# Patient Record
Sex: Male | Born: 1965 | Race: Black or African American | Hispanic: No | Marital: Single | Smoking: Never smoker
Health system: Southern US, Community
[De-identification: ages and names within clinical notes are randomized; demographics above are authoritative.]

## PROBLEM LIST (undated history)

## (undated) DIAGNOSIS — D696 Thrombocytopenia, unspecified: Secondary | ICD-10-CM

## (undated) DIAGNOSIS — C7800 Secondary malignant neoplasm of unspecified lung: Secondary | ICD-10-CM

## (undated) DIAGNOSIS — C229 Malignant neoplasm of liver, not specified as primary or secondary: Secondary | ICD-10-CM

## (undated) HISTORY — DX: Thrombocytopenia, unspecified: D69.6

---

## 1997-06-05 ENCOUNTER — Emergency Department (HOSPITAL_COMMUNITY): Admission: EM | Admit: 1997-06-05 | Discharge: 1997-06-05 | Payer: Self-pay | Admitting: Emergency Medicine

## 2000-11-16 ENCOUNTER — Emergency Department (HOSPITAL_COMMUNITY): Admission: EM | Admit: 2000-11-16 | Discharge: 2000-11-16 | Payer: Self-pay | Admitting: Emergency Medicine

## 2000-11-16 ENCOUNTER — Encounter: Payer: Self-pay | Admitting: Emergency Medicine

## 2001-03-15 ENCOUNTER — Emergency Department (HOSPITAL_COMMUNITY): Admission: EM | Admit: 2001-03-15 | Discharge: 2001-03-15 | Payer: Self-pay | Admitting: Emergency Medicine

## 2001-03-15 ENCOUNTER — Encounter: Payer: Self-pay | Admitting: Emergency Medicine

## 2001-04-24 ENCOUNTER — Emergency Department (HOSPITAL_COMMUNITY): Admission: EM | Admit: 2001-04-24 | Discharge: 2001-04-24 | Payer: Self-pay | Admitting: *Deleted

## 2001-10-16 ENCOUNTER — Inpatient Hospital Stay (HOSPITAL_COMMUNITY): Admission: EM | Admit: 2001-10-16 | Discharge: 2001-10-19 | Payer: Self-pay | Admitting: Emergency Medicine

## 2001-10-16 ENCOUNTER — Encounter: Payer: Self-pay | Admitting: Internal Medicine

## 2001-10-16 ENCOUNTER — Encounter: Payer: Self-pay | Admitting: Emergency Medicine

## 2001-10-17 ENCOUNTER — Encounter: Payer: Self-pay | Admitting: Cardiology

## 2001-10-31 ENCOUNTER — Emergency Department (HOSPITAL_COMMUNITY): Admission: EM | Admit: 2001-10-31 | Discharge: 2001-10-31 | Payer: Self-pay

## 2001-11-05 ENCOUNTER — Encounter: Admission: RE | Admit: 2001-11-05 | Discharge: 2001-11-05 | Payer: Self-pay | Admitting: Internal Medicine

## 2001-11-26 ENCOUNTER — Encounter: Admission: RE | Admit: 2001-11-26 | Discharge: 2001-11-26 | Payer: Self-pay | Admitting: Internal Medicine

## 2001-12-14 ENCOUNTER — Encounter: Payer: Self-pay | Admitting: Internal Medicine

## 2001-12-14 ENCOUNTER — Ambulatory Visit (HOSPITAL_COMMUNITY): Admission: RE | Admit: 2001-12-14 | Discharge: 2001-12-14 | Payer: Self-pay | Admitting: Internal Medicine

## 2002-06-17 ENCOUNTER — Emergency Department (HOSPITAL_COMMUNITY): Admission: EM | Admit: 2002-06-17 | Discharge: 2002-06-17 | Payer: Self-pay | Admitting: Emergency Medicine

## 2002-06-21 ENCOUNTER — Encounter: Admission: RE | Admit: 2002-06-21 | Discharge: 2002-06-21 | Payer: Self-pay | Admitting: Internal Medicine

## 2003-09-09 ENCOUNTER — Emergency Department (HOSPITAL_COMMUNITY): Admission: EM | Admit: 2003-09-09 | Discharge: 2003-09-09 | Payer: Self-pay | Admitting: Emergency Medicine

## 2003-09-27 ENCOUNTER — Emergency Department (HOSPITAL_COMMUNITY): Admission: EM | Admit: 2003-09-27 | Discharge: 2003-09-27 | Payer: Self-pay | Admitting: Emergency Medicine

## 2004-07-11 ENCOUNTER — Emergency Department (HOSPITAL_COMMUNITY): Admission: EM | Admit: 2004-07-11 | Discharge: 2004-07-11 | Payer: Self-pay | Admitting: *Deleted

## 2004-11-03 ENCOUNTER — Emergency Department (HOSPITAL_COMMUNITY): Admission: EM | Admit: 2004-11-03 | Discharge: 2004-11-03 | Payer: Self-pay | Admitting: Emergency Medicine

## 2005-04-03 ENCOUNTER — Emergency Department (HOSPITAL_COMMUNITY): Admission: EM | Admit: 2005-04-03 | Discharge: 2005-04-03 | Payer: Self-pay | Admitting: Emergency Medicine

## 2005-11-28 ENCOUNTER — Emergency Department (HOSPITAL_COMMUNITY): Admission: EM | Admit: 2005-11-28 | Discharge: 2005-11-28 | Payer: Self-pay | Admitting: Emergency Medicine

## 2006-01-03 ENCOUNTER — Emergency Department (HOSPITAL_COMMUNITY): Admission: EM | Admit: 2006-01-03 | Discharge: 2006-01-03 | Payer: Self-pay | Admitting: Emergency Medicine

## 2006-11-25 ENCOUNTER — Inpatient Hospital Stay (HOSPITAL_COMMUNITY): Admission: EM | Admit: 2006-11-25 | Discharge: 2006-11-26 | Payer: Self-pay | Admitting: Emergency Medicine

## 2007-04-10 ENCOUNTER — Emergency Department (HOSPITAL_COMMUNITY): Admission: EM | Admit: 2007-04-10 | Discharge: 2007-04-10 | Payer: Self-pay | Admitting: Emergency Medicine

## 2008-04-11 ENCOUNTER — Emergency Department (HOSPITAL_COMMUNITY): Admission: EM | Admit: 2008-04-11 | Discharge: 2008-04-11 | Payer: Self-pay | Admitting: Emergency Medicine

## 2009-01-14 ENCOUNTER — Emergency Department (HOSPITAL_COMMUNITY): Admission: EM | Admit: 2009-01-14 | Discharge: 2009-01-14 | Payer: Self-pay | Admitting: Emergency Medicine

## 2010-01-24 ENCOUNTER — Emergency Department (HOSPITAL_COMMUNITY): Admission: EM | Admit: 2010-01-24 | Discharge: 2010-01-24 | Payer: Self-pay | Admitting: Emergency Medicine

## 2010-01-25 ENCOUNTER — Ambulatory Visit: Payer: Self-pay | Admitting: Internal Medicine

## 2010-05-11 LAB — CBC
MCHC: 35.3 g/dL (ref 30.0–36.0)
Platelets: 60 10*3/uL — ABNORMAL LOW (ref 150–400)
RBC: 4.62 MIL/uL (ref 4.22–5.81)
RDW: 13.7 % (ref 11.5–15.5)

## 2010-05-11 LAB — DIFFERENTIAL
Eosinophils Absolute: 0 10*3/uL (ref 0.0–0.7)
Lymphocytes Relative: 28 % (ref 12–46)
Monocytes Relative: 11 % (ref 3–12)
Neutro Abs: 1.8 10*3/uL (ref 1.7–7.7)
Neutrophils Relative %: 59 % (ref 43–77)

## 2010-06-02 LAB — DIFFERENTIAL
Basophils Absolute: 0 10*3/uL (ref 0.0–0.1)
Basophils Relative: 0 % (ref 0–1)
Eosinophils Absolute: 0 10*3/uL (ref 0.0–0.7)
Neutro Abs: 1.7 10*3/uL (ref 1.7–7.7)
Neutrophils Relative %: 44 % (ref 43–77)

## 2010-06-02 LAB — BASIC METABOLIC PANEL
CO2: 29 mEq/L (ref 19–32)
Calcium: 9.3 mg/dL (ref 8.4–10.5)
Chloride: 105 mEq/L (ref 96–112)
GFR calc non Af Amer: >60 mL/min (ref >60)
Glucose, Bld: 83 mg/dL (ref 70–99)
Potassium: 3.7 mEq/L (ref 3.5–5.1)
Sodium: 141 mEq/L (ref 135–145)

## 2010-06-02 LAB — CBC: RDW: 14.1 % (ref 11.5–15.5)

## 2010-06-02 LAB — CK TOTAL AND CKMB (NOT AT ARMC): CK, MB: 1.7 ng/mL (ref 0.3–4.0)

## 2010-06-02 LAB — POCT CARDIAC MARKERS: Myoglobin, poc: 92.1 ng/mL (ref 12–200)

## 2010-07-13 NOTE — Op Note (Signed)
James Woodard, James Woodard               ACCOUNT NO.:  [id number]   MEDICAL RECORD NO.:  [id number]          PATIENT TYPE:  INP   LOCATION:  0104                         FACILITY:  [facility]   PHYSICIAN:  Dionne Ano. Gramig III, M.D.DATE OF BIRTH:  05/28/1965   DATE OF PROCEDURE:  11/25/2006  DATE OF DISCHARGE:                               OPERATIVE REPORT   PREOPERATIVE DIAGNOSIS:  Displaced right fourth metacarpal fracture.   POSTOPERATIVE DIAGNOSIS:  Displaced right fourth metacarpal fracture.   PROCEDURE:  1. Open reduction and internal fixation with intermaxillary fixation,      right fourth metacarpal mid shaft fracture.  2. Stress radiography.   SURGEON:  Dionne Ano. Amanda Pea, M.D.   ASSISTANT:  None.   COMPLICATIONS:  None.   ANESTHESIA:  General.   TOURNIQUET TIME:  Less than an hour.   INDICATIONS FOR PROCEDURE:  This patient is a 45 year old male who  sustained the above mentioned injury at approximately 2:30 a.m. on  November 25, 2006.  I was asked see him later in the morning, November 25, 2006, and assumed his care.  I have discussed with patient his  predicament, given the transverse nature of his fracture and  derangement, I have discussed with him ORIF.  He understood the risks  and benefits and desired to proceed, of course.   OPERATION IN DETAIL:  The patient was seen by myself and anesthesia,  taken to the operating room suite and underwent the smooth induction of  general anesthesia.  He was prepped and draped in the usual sterile  fashion with Betadine scrub and paint.  Following this, 2 grams of Ancef  were given and allowed to circulate.  Once this was accomplished, the  patient then underwent a transverse incision at the fourth metacarpal  base of the right hand.  Dissection was carried out, pilot hole was made  for IM device.  Next, the patient had a transverse incision made over  the fracture site and the fracture was then reduced.  I then threaded a  0.062 blunt ended K-wire across the fracture site, achieved excellent  fixation in the AP and lateral plane.  There were no complicating  features.  Once this was done, I then performed pre-bending and final  seating of the pin and its apparatus.  The patient had the pin snugged  down nice without complicating features and there were no issues such as  malrotation, deformity, etc., noted.  I was pleased with the coaptation  of the bony ends, the fixation, and his rotatory alignment about the  fingers.  I deflated the tourniquet, obtained hemostasis, and closed the  wound with Prolene. 17 mL of 0.25% Sensorcaine with epinephrine was  placed for postop analgesia and he was then placed in a sterile dressing  and dorsal splint.  He was extubated and taken to the recovery room.  He  will be monitored overnight for pain control.  He will be given IV  antibiotics, general postop precautions will be adhered to, and he will,  of course, follow up in our office in 10-14  days with  x-rays, removal of the sutures, and cast placement.  I will  plan for 4-6 weeks of immobilization followed by re-mobilization of the  hand.  He will need 8-10 weeks before heavy activities and likely 10-12  weeks before weight lifting, etc.  All questions have been encouraged  and answered.           ______________________________  Dionne Ano. Everlene Other, M.D.     Nash Mantis  D:  11/25/2006  T:  11/26/2006  Job:  [id number]

## 2010-07-16 NOTE — Op Note (Signed)
James Woodard, James Woodard                         ACCOUNT NO.:  [id number]   MEDICAL RECORD NO.:  [id number]                   PATIENT TYPE:  INP   LOCATION:  4702                                 FACILITY:  [facility]   PHYSICIAN:  Doylene Canning. Ladona Ridgel, M.D. [facility]           DATE OF BIRTH:  [birth year]   DATE OF PROCEDURE:  10/19/2001  DATE OF DISCHARGE:                                 OPERATIVE REPORT   PROCEDURE PERFORMED:  Head up tilt table test.   INDICATIONS FOR PROCEDURE:  Unexplained syncope.   INTRODUCTION:  The patient is a very pleasant 45 year old man who was  admitted to the hospital with recurrent episodes of syncope in conjunction  with neutropenia and thrombocytopenia.  He had no obvious etiology for  syncope.  A 2-D echo was normal as was his 12-lead EKG.  He is now referred  for head up tilt table testing.  Of note, the patient had exercise treadmill  testing demonstrating no evidence of  ischemia and no arrhythmias.   DESCRIPTION OF PROCEDURE:  After informed consent was obtained, the patient  was taken to the diagnostic electrophysiology laboratory in the fasted  state.  He was placed in the supine position where the blood pressure was  107/74.  The pulse was 53.  He was allowed to stay in this position for 10  minutes and then placed in the head up position where his blood pressure  remained stable in the 114/80 range.  His heart rate increased from 57 to 70  beats per minute.  At approximately 11 minutes into tilting, he became hot  but his vitals remained stable with his heart rate in the 60 to 70 range and  systolic blood pressure in the 110 to 120 range.  He was continued this  position for 30 minutes and had no significant hemodynamic changes.  He was  placed back in the supine position where his blood pressure remained stable.  His heart rate returned down into 50 range.  Isoproterenol was initiated at  1.5 mcg and his heart rate immediately increased from the 50s to the  80s and  he was placed back in the head up tilt table position at 70 degrees of  tilting.  His blood pressure remained stable in the 110 to 120 range and his  heart rate increased eventually up to 113 beats per minute.  During this  period he felt again hot but did not have any hemodynamic instability.  He  was placed back in the supine position and returned to his room in  satisfactory condition.   COMPLICATIONS:  None.   RESULTS:  This study demonstrates no hemodynamic evidence of an early  mediated syncope.  Doylene Canning. Ladona Ridgel, M.D. [facility]    GWT/MEDQ  D:  10/19/2001  T:  10/23/2001  Job:  [id number]   cc:   Alvester Morin, M.D.   [facility]
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
## 2010-07-16 NOTE — Discharge Summary (Signed)
James Woodard, James Woodard                         ACCOUNT NO.:  [id number]   MEDICAL RECORD NO.:  [id number]                   PATIENT TYPE:  INP   LOCATION:  4702                                 FACILITY:  [facility]   PHYSICIAN:  Alvester Morin, M.D.               DATE OF BIRTH:  10-27-1965   DATE OF ADMISSION:  10/16/2001  DATE OF DISCHARGE:  10/19/2001                                 DISCHARGE SUMMARY   MEDICATIONS ON DISCHARGE:  None.   DIAGNOSES ON DISCHARGE:  1. Syncope.  2. Thrombocytopenia not otherwise specified.  3. Hypoglycemia, now otherwise specified.  4. Nonspecific skin eruption.   CHIEF COMPLAINT:  Per the patient, I passed out.   HISTORY OF PRESENT ILLNESS:  This is a 45 year old African-American male  with no significant past medical history that presents to the ED with one  month history of syncopal episode, last one occurring on the day of  admission. They last a few seconds to minutes.  No aura.  No confusion.  The  episode on the day of admission was different in that it lasted two hours  per the patient.  The patient denies having any nausea or vomiting.  No  chest pain.  No shortness of breath.  No palpitations.  Admits to having  frequent temporal headaches increased during this time in relationship to  the syncopal episodes.  The headaches are at different times, not  necessarily in the morning.  He has no changes in his memory nor in his  ability to concentrate.  No seizure activity.  Upon arriving to the ED the  patient had a CBG of 31.  He denies any history of diabetes, family history  of insulin injection, however, the patient on review of systems did report  recent onset of proliferative changes this presentation.   ALLERGIES:  No known drug allergies.   PAST MEDICAL HISTORY:  None.   MEDICATIONS:  None.   SOCIAL HISTORY:  The patient denies ever smoking.  He denies alcohol.  Denies drug abuse.  The patient is single.  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  His health care is financed through self pay.  The patient  does have three children who are healthy and alive.   FAMILY HISTORY:  Significant for maternal grandmother with diabetes.  Three  maternal aunts with diabetes and a history of migraine in secondary  relatives.   IMMUNIZATIONS:  Up-to-date per the patient and the patient's mother's  testimony.  Tetanus shot was two years ago.   PROCEDURES:  The patient had a CT scan of the head on October 16, 2001.  The  report showed most likely prominent anomalies but underlying cerebellar  lesion cannot be ruled out.  Follow up with MRI.  MRI was done also on  October 16, 2001 which showed no lesion.  The patient had a 2-D echo on  August 20. 2003 which showed left ventricular systolic function normal, left  ventricular ejection fraction 55-65%, no regional wall abnormality.  The  patient had a stress test on October 19, 2001 which showed no abnormalities.  The patient also had a head tilt test on October 19, 2001 which showed no  hemodynamic evidence of mediated signal.   LABORATORY DATA:  On admission the patient's urine drug screen was negative.  Alcohol was less than 5.  We followed up with an HIV test on 10/18/01 which  was nonreactive.  On admission his hemoglobin was 13.7, his white blood  count was 15.2, hematocrit 45, platelets 70.  On the day of discharge white  blood count was 3.2, hemoglobin 14.3 and platelets 61.  The patient had, on  admission, cardiac enzymes:  CK 989, CK MB 2.3, relative index 0.2, cardiac  markers troponin 0.03.  Second set:  CK 773, CK MB 1.7, relative index 0.2,  troponin 0.01.  Recheck CK 649, CK MB 1.5, relative index 0.2, troponin  0.01.  AST was 49, ALT 46, alkaline phosphatase 60, total bilirubin 1.0,  direct bilirubin 0.2, magnesium 2.0, phosphorus 3.7, lipase 33.  Sodium 140,  potassium 3.5, chloride 107, carbon dioxide 27.0, glucose 137, BUN 16,  creatinine 1.4, calcium  9.3.   HOSPITAL COURSE:  During hospitalization the following course was observed:  #1 - SYNCOPE.  The patient did not have any additional syncopal episodes  while he was in the hospital.  He was in telemetry which showed no changes  on his EKG of sinus rhythm.  His CBGs remained stable in the 100s.  He never  became hypoglycemic again nor did he develop hyperglycemia.  His cardiac  workup as mentioned earlier was negative.  His CT which was initially  doubtful for a possible lesion was later confirmed with an MRI that was  negative.  #2 - THROMBOCYTOPENIA. Thrombocytopenia, which we are still trying to  workup.  The patient has a followup as an outpatient to continue working up  the reason for his low platelets.  #3 - HYPERPIGMENTED MACULAR RASH ON UPPER AND MID BACK.  Per the patient  this has been a constant problem for him.  He denies any itching or any  problems with it. This does appear to be a fungal infection.  Will follow up  as an outpatient.  #4 - POLYURIA AND POLYDIPSIA.  During the hospitalization the patient never  became hyperglycemic therefore ruling out any possible etiology of diabetes.  However, the patient is a very active person.  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  After that he  does increase frequency of urine.   FOLLOWUP:  The patient was recommended not to drive for six months until we  can clear him from any syncopal episode.  It is also recommended that he  decrease his exercise activity, recommended that he drink plenty of fluids  and also sodium secondary to his increased activity.  Recommended that if he  has ________________  please to call the clinic or to phone Dr. Lowella Bandy at  [phone].  He has an appointment with Dr. ________ in [facility] in 2-  3 weeks.     Chuck Hint, M.D.                       Alvester Morin, M.D.   LC/MEDQ  D:  10/24/2001  T:  10/26/2001  Job:  [id number]

## 2010-09-06 ENCOUNTER — Inpatient Hospital Stay: Payer: Self-pay | Admitting: Internal Medicine

## 2010-10-23 ENCOUNTER — Emergency Department: Payer: Self-pay | Admitting: Internal Medicine

## 2010-12-09 LAB — BASIC METABOLIC PANEL
CO2: 26
Creatinine, Ser: 1.3
GFR calc non Af Amer: >60
Glucose, Bld: 98
Potassium: 4.2
Sodium: 136

## 2010-12-09 LAB — HEMOGLOBIN AND HEMATOCRIT, BLOOD
HCT: 42.5
Hemoglobin: 15.1

## 2011-05-23 ENCOUNTER — Ambulatory Visit: Payer: Self-pay | Admitting: Gastroenterology

## 2011-06-20 ENCOUNTER — Ambulatory Visit: Payer: Self-pay | Admitting: Gastroenterology

## 2013-06-12 ENCOUNTER — Ambulatory Visit: Payer: Self-pay | Admitting: Family Medicine

## 2013-06-12 VITALS — BP 132/92 | HR 86 | Temp 98.0°F | Resp 16 | Ht 74.0 in | Wt 238.0 lb

## 2013-06-12 DIAGNOSIS — R110 Nausea: Secondary | ICD-10-CM

## 2013-06-12 DIAGNOSIS — D696 Thrombocytopenia, unspecified: Secondary | ICD-10-CM

## 2013-06-12 DIAGNOSIS — K529 Noninfective gastroenteritis and colitis, unspecified: Secondary | ICD-10-CM

## 2013-06-12 DIAGNOSIS — R197 Diarrhea, unspecified: Secondary | ICD-10-CM

## 2013-06-12 DIAGNOSIS — K5289 Other specified noninfective gastroenteritis and colitis: Secondary | ICD-10-CM

## 2013-06-12 LAB — POCT CBC
Granulocyte percent: 56.4 %G (ref 37–80)
HCT, POC: 43.9 % (ref 43.5–53.7)
Hemoglobin: 14.2 g/dL (ref 14.1–18.1)
Lymph, poc: 1.1 (ref 0.6–3.4)
MCH, POC: 31 pg (ref 27–31.2)
MCHC: 32.3 g/dL (ref 31.8–35.4)
MCV: 95.9 fL (ref 80–97)
MID (cbc): 0.3 (ref 0–0.9)
MPV: 9.7 fL (ref 0–99.8)
POC Granulocyte: 1.9 — AB (ref 2–6.9)
POC LYMPH PERCENT: 33.7 % (ref 10–50)
POC MID %: 9.9 %M (ref 0–12)
Platelet Count, POC: 98 10*3/uL — AB (ref 142–424)
RBC: 4.58 M/uL — AB (ref 4.69–6.13)
RDW, POC: 12.7 %
WBC: 3.4 10*3/uL — AB (ref 4.6–10.2)

## 2013-06-12 NOTE — Patient Instructions (Signed)
Diet for Diarrhea, Adult  Frequent, runny stools (diarrhea) may be caused or worsened by food or drink. Diarrhea may be relieved by changing your diet. Since diarrhea can last up to 7 days, it is easy for you to lose too much fluid from the body and become dehydrated. Fluids that are lost need to be replaced. Along with a modified diet, make sure you drink enough fluids to keep your urine clear or pale yellow.  DIET INSTRUCTIONS  · Ensure adequate fluid intake (hydration): have 1 cup (8 oz) of fluid for each diarrhea episode. Avoid fluids that contain simple sugars or sports drinks, fruit juices, whole milk products, and sodas. Your urine should be clear or pale yellow if you are drinking enough fluids. Hydrate with an oral rehydration solution that you can purchase at pharmacies, retail stores, and online. You can prepare an oral rehydration solution at home by mixing the following ingredients together:  ·   tsp table salt.  · ¾ tsp baking soda.  ·  tsp salt substitute containing potassium chloride.  · 1  tablespoons sugar.  · 1 L (34 oz) of water.  · Certain foods and beverages may increase the speed at which food moves through the gastrointestinal (GI) tract. These foods and beverages should be avoided and include:  · Caffeinated and alcoholic beverages.  · High-fiber foods, such as raw fruits and vegetables, nuts, seeds, and whole grain breads and cereals.  · Foods and beverages sweetened with sugar alcohols, such as xylitol, sorbitol, and mannitol.  · Some foods may be well tolerated and may help thicken stool including:  · Starchy foods, such as rice, toast, pasta, low-sugar cereal, oatmeal, grits, baked potatoes, crackers, and bagels.    · Bananas.    · Applesauce.  · Add probiotic-rich foods to help increase healthy bacteria in the GI tract, such as yogurt and fermented milk products.  RECOMMENDED FOODS AND BEVERAGES  Starches  Choose foods with less than 2 g of fiber per serving.  · Recommended:  White,  French, and pita breads, plain rolls, buns, bagels. Plain muffins, matzo. Soda, saltine, or graham crackers. Pretzels, melba toast, zwieback. Cooked cereals made with water: cornmeal, farina, cream cereals. Dry cereals: refined corn, wheat, rice. Potatoes prepared any way without skins, refined macaroni, spaghetti, noodles, refined rice.  · Avoid:  Bread, rolls, or crackers made with whole wheat, multi-grains, rye, bran seeds, nuts, or coconut. Corn tortillas or taco shells. Cereals containing whole grains, multi-grains, bran, coconut, nuts, raisins. Cooked or dry oatmeal. Coarse wheat cereals, granola. Cereals advertised as "high-fiber." Potato skins. Whole grain pasta, wild or brown rice. Popcorn. Sweet potatoes, yams. Sweet rolls, doughnuts, waffles, pancakes, sweet breads.  Vegetables  · Recommended: Strained tomato and vegetable juices. Most well-cooked and canned vegetables without seeds. Fresh: Tender lettuce, cucumber without the skin, cabbage, spinach, bean sprouts.  · Avoid: Fresh, cooked, or canned: Artichokes, baked beans, beet greens, broccoli, Brussels sprouts, corn, kale, legumes, peas, sweet potatoes. Cooked: Green or red cabbage, spinach. Avoid large servings of any vegetables because vegetables shrink when cooked, and they contain more fiber per serving than fresh vegetables.  Fruit  · Recommended: Cooked or canned: Apricots, applesauce, cantaloupe, cherries, fruit cocktail, grapefruit, grapes, kiwi, mandarin oranges, peaches, pears, plums, watermelon. Fresh: Apples without skin, ripe banana, grapes, cantaloupe, cherries, grapefruit, peaches, oranges, plums. Keep servings limited to ½ cup or 1 piece.  · Avoid: Fresh: Apples with skin, apricots, mangoes, pears, raspberries, strawberries. Prune juice, stewed or dried prunes. Dried   fruits, raisins, dates. Large servings of all fresh fruits.  Protein  · Recommended: Ground or well-cooked tender beef, ham, veal, lamb, pork, or poultry. Eggs. Fish,  oysters, shrimp, lobster, other seafoods. Liver, organ meats.  · Avoid: Tough, fibrous meats with gristle. Peanut butter, smooth or chunky. Cheese, nuts, seeds, legumes, dried peas, beans, lentils.  Dairy  · Recommended: Yogurt, lactose-free milk, kefir, drinkable yogurt, buttermilk, soy milk, or plain hard cheese.  · Avoid: Milk, chocolate milk, beverages made with milk, such as milkshakes.  Soups  · Recommended: Bouillon, broth, or soups made from allowed foods. Any strained soup.  · Avoid: Soups made from vegetables that are not allowed, cream or milk-based soups.  Desserts and Sweets  · Recommended: Sugar-free gelatin, sugar-free frozen ice pops made without sugar alcohol.  · Avoid: Plain cakes and cookies, pie made with fruit, pudding, custard, cream pie. Gelatin, fruit, ice, sherbet, frozen ice pops. Ice cream, ice milk without nuts. Plain hard candy, honey, jelly, molasses, syrup, sugar, chocolate syrup, gumdrops, marshmallows.  Fats and Oils  · Recommended: Limit fats to less than 8 tsp per day.  · Avoid: Seeds, nuts, olives, avocados. Margarine, butter, cream, mayonnaise, salad oils, plain salad dressings. Plain gravy, crisp bacon without rind.  Beverages  · Recommended: Water, decaffeinated teas, oral rehydration solutions, sugar-free beverages not sweetened with sugar alcohols.  · Avoid: Fruit juices, caffeinated beverages (coffee, tea, soda), alcohol, sports drinks, or lemon-lime soda.  Condiments  · Recommended: Ketchup, mustard, horseradish, vinegar, cocoa powder. Spices in moderation: allspice, basil, bay leaves, celery powder or leaves, cinnamon, cumin powder, curry powder, ginger, mace, marjoram, onion or garlic powder, oregano, paprika, parsley flakes, ground pepper, rosemary, sage, savory, tarragon, thyme, turmeric.  · Avoid: Coconut, honey.  Document Released: 05/07/2003 Document Revised: 11/09/2011 Document Reviewed: 07/01/2011  [employer]® Patient Information ©2014 [employer].

## 2013-06-12 NOTE — Progress Notes (Signed)
Chief Complaint:  Chief Complaint  Patient presents with  . Nausea    x 1 week  . Diarrhea    HPI: James Woodard is a 48 y.o. male who is here for  Nausea and nonbloody diarrhea which is getting better for the last 1 week.  Last Tuesday ate flounder at [facility] [employer], following day had  stomach cramps, diarrhea , and then got constipated. He tried to make himself puke and thinks he may have hurt a rib. He feels minimally better today. No fevers, + chllls the other night.  He has been having loose stools, nonbloody, he has about 1-2 times a day,  When he can actually have BM otherwise he feels constipated.  He  feels like he has to go but he can;t .  He does not feel nauseated, he still does not fell 100 % Sometimes he can;t sleep because he can;t use the bathroom, he is uncomfortable. He feels like something is stuck inhis gut.   When he tried to make himself throw up he feels her felt something pop in his right ribcage,  He tried peptobismol, fleets enema x 1. Denies bloating or gas. He has been eating his regular diet. Baked and greens. He has also tried this new cleanse No h/o hepatitis or IVDA  Past Medical History  Diagnosis Date  . Thrombocytopenia    History reviewed. No pertinent past surgical history. History   Social History  . Marital Status: Single    Spouse Name: N/A    Number of Children: N/A  . Years of Education: N/A   Social History Main Topics  . Smoking status: Never Smoker   . Smokeless tobacco: None  . Alcohol Use: No  . Drug Use: No  . Sexual Activity: Yes   Other Topics Concern  . None   Social History Narrative  . None   History reviewed. No pertinent family history. No Known Allergies Prior to Admission medications   Not on File     ROS: The patient denies fevers, chills, night sweats, unintentional weight loss, chest pain, palpitations, wheezing, dyspnea on exertion,  vomiting,  dysuria, hematuria, melena, numbness,  weakness, or tingling.   All other systems have been reviewed and were otherwise negative with the exception of those mentioned in the HPI and as above.    PHYSICAL EXAM: Filed Vitals:   06/12/13 2041  BP: 132/92  Pulse: 86  Temp: 98 F (36.7 C)  Resp: 16   Filed Vitals:   06/12/13 2041  Height: 6\' 2"  (1.88 m)  Weight: 238 lb (107.956 kg)   Body mass index is 30.54 kg/(m^2).  General: Alert, no acute distress HEENT:  Normocephalic, atraumatic, oropharynx patent. EOMI, PERRLA Cardiovascular:  Regular rate and rhythm, no rubs murmurs or gallops.  No Carotid bruits, radial pulse intact. No pedal edema.  Respiratory: Clear to auscultation bilaterally.  No wheezes, rales, or rhonchi.  No cyanosis, no use of accessory musculature GI: No organomegaly, abdomen is soft and non-tender, positive bowel sounds.  No masses. Skin: No rashes. Neurologic: Facial musculature symmetric. Psychiatric: Patient is appropriate throughout our interaction. Lymphatic: No cervical lymphadenopathy Musculoskeletal: Gait intact.   LABS: Results for orders placed in visit on 06/12/13  COMPREHENSIVE METABOLIC PANEL      Result Value Ref Range   Sodium 139  135 - 145 mEq/L   Potassium 4.7  3.5 - 5.3 mEq/L   Chloride 104  96 - 112 mEq/L  CO2 26  19 - 32 mEq/L   Glucose, Bld 87  70 - 99 mg/dL   BUN 16  6 - 23 mg/dL   Creat 1.32  0.50 - 1.35 mg/dL   Total Bilirubin 0.9  0.2 - 1.2 mg/dL   Alkaline Phosphatase 53  39 - 117 U/L   AST 79 (*) 0 - 37 U/L   ALT 138 (*) 0 - 53 U/L   Total Protein 7.1  6.0 - 8.3 g/dL   Albumin 3.9  3.5 - 5.2 g/dL   Calcium 9.1  8.4 - 10.5 mg/dL  POCT CBC      Result Value Ref Range   WBC 3.4 (*) 4.6 - 10.2 K/uL   Lymph, poc 1.1  0.6 - 3.4   POC LYMPH PERCENT 33.7  10 - 50 %L   MID (cbc) 0.3  0 - 0.9   POC MID % 9.9  0 - 12 %M   POC Granulocyte 1.9 (*) 2 - 6.9   Granulocyte percent 56.4  37 - 80 %G   RBC 4.58 (*) 4.69 - 6.13 M/uL   Hemoglobin 14.2  14.1 - 18.1 g/dL    HCT, POC 43.9  43.5 - 53.7 %   MCV 95.9  80 - 97 fL   MCH, POC 31.0  27 - 31.2 pg   MCHC 32.3  31.8 - 35.4 g/dL   RDW, POC 12.7     Platelet Count, POC 98 (*) 142 - 424 K/uL   MPV 9.7  0 - 99.8 fL     EKG/XRAY:   Primary read interpreted by Dr. Marin Comment at [facility].   ASSESSMENT/PLAN: Encounter Diagnoses  Name Primary?  . Diarrhea   . Nausea alone   . Gastroenteritis Yes  . Thrombocytopenia    Refer to hematology for chronic thrombocytopenia, he states he has had workup but does not know when and where info is.  He denies any STDs or immunocompromise or B sxs. Denies hepatitis.  GI sxs improving may be  Food related vs viral gastroenteritis CMP pending F/u prn    Gross sideeffects, risk and benefits, and alternatives of medications d/w patient. Patient is aware that all medications have potential sideeffects and we are unable to predict every sideeffect or drug-drug interaction that may occur.  Glenford Bayley, DO 06/15/2013 9:51 AM

## 2013-06-13 LAB — COMPREHENSIVE METABOLIC PANEL WITH GFR
ALT: 138 U/L — ABNORMAL HIGH (ref 0–53)
AST: 79 U/L — ABNORMAL HIGH (ref 0–37)
Albumin: 3.9 g/dL (ref 3.5–5.2)
BUN: 16 mg/dL (ref 6–23)
CO2: 26 meq/L (ref 19–32)
Calcium: 9.1 mg/dL (ref 8.4–10.5)
Potassium: 4.7 meq/L (ref 3.5–5.3)
Sodium: 139 meq/L (ref 135–145)

## 2013-06-13 LAB — COMPREHENSIVE METABOLIC PANEL
Alkaline Phosphatase: 53 U/L (ref 39–117)
Chloride: 104 mEq/L (ref 96–112)
Creat: 1.32 mg/dL (ref 0.50–1.35)
Glucose, Bld: 87 mg/dL (ref 70–99)
Total Bilirubin: 0.9 mg/dL (ref 0.2–1.2)
Total Protein: 7.1 g/dL (ref 6.0–8.3)

## 2013-06-15 ENCOUNTER — Encounter: Payer: Self-pay | Admitting: Family Medicine

## 2013-06-17 ENCOUNTER — Telehealth: Payer: Self-pay | Admitting: Family Medicine

## 2013-06-17 NOTE — Telephone Encounter (Signed)
LM that liver enzymes elevated. Would like to add hepatitis testing to his prior blood work. Would like to see if he approves of this since self pay. He has been referred to hematology for his chronic thrombocytopenia.

## 2013-06-18 ENCOUNTER — Telehealth: Payer: Self-pay | Admitting: Internal Medicine

## 2013-06-18 NOTE — Telephone Encounter (Signed)
LEFT MESSAGE FOR PATIENT TOR RETURN CALL TO SCHEDULE NEW PATIENT APPT

## 2013-06-21 ENCOUNTER — Encounter: Payer: Self-pay | Admitting: Family Medicine

## 2013-06-21 ENCOUNTER — Telehealth: Payer: Self-pay | Admitting: Internal Medicine

## 2013-06-21 NOTE — Telephone Encounter (Signed)
LEFT MESSAGE FOR PATIENT TO RETURN CALL TO SCHEDULE NEW PATIENT APPT.  °

## 2013-07-22 ENCOUNTER — Ambulatory Visit: Payer: Self-pay

## 2013-07-22 ENCOUNTER — Ambulatory Visit: Payer: Self-pay | Admitting: Family Medicine

## 2013-07-22 VITALS — BP 120/80 | HR 89 | Temp 98.3°F | Resp 16 | Ht 75.5 in | Wt 232.0 lb

## 2013-07-22 DIAGNOSIS — M549 Dorsalgia, unspecified: Secondary | ICD-10-CM

## 2013-07-22 DIAGNOSIS — R9389 Abnormal findings on diagnostic imaging of other specified body structures: Secondary | ICD-10-CM

## 2013-07-22 DIAGNOSIS — R918 Other nonspecific abnormal finding of lung field: Secondary | ICD-10-CM

## 2013-07-22 LAB — POCT CBC
Granulocyte percent: 55.8 %G (ref 37–80)
HCT, POC: 48.8 % (ref 43.5–53.7)
Hemoglobin: 15.6 g/dL (ref 14.1–18.1)
Lymph, poc: 1.6 (ref 0.6–3.4)
MCH, POC: 31.1 pg (ref 27–31.2)
MCHC: 32 g/dL (ref 31.8–35.4)
MCV: 97.4 fL — AB (ref 80–97)
MID (cbc): 0.6 (ref 0–0.9)
MPV: 9.5 fL (ref 0–99.8)
POC Granulocyte: 2.7 (ref 2–6.9)
POC LYMPH PERCENT: 32.5 %L (ref 10–50)
POC MID %: 11.7 %M (ref 0–12)
Platelet Count, POC: 125 10*3/uL — AB (ref 142–424)
RBC: 5.01 M/uL (ref 4.69–6.13)
RDW, POC: 14.9 %
WBC: 4.8 10*3/uL (ref 4.6–10.2)

## 2013-07-22 NOTE — Addendum Note (Signed)
Addended by: Kyra Manges on: 07/22/2013 04:57 PM   Modules accepted: Orders

## 2013-07-22 NOTE — Patient Instructions (Signed)
We will call you tomorrow to get a chest CAT scan and proceed from there. Laboratory tests should help also help [place] determine what is causing this

## 2013-07-22 NOTE — Progress Notes (Addendum)
° °  Subjective:    Patient ID: James Woodard, male    DOB: May 17, 1965, 48 y.o.   MRN: [id number]  Back Pain   Chief Complaint  Patient presents with   Back Pain    upper back x 1 week   This chart was scribed for Robyn Haber, MD by Thea Alken, ED Scribe. This patient was seen in room 14 and the patient's care was started at 3:39 PM.  HPI Comments: James Woodard is a 48 y.o. male who presents to the [facility] complaining of lower back pain. Pt reports he was training at the gym 6 days ago and when he went back 5 days ago to repeat the workout his back felt tight. He reports at the time he felt the pain he was lifting a bar that was about 100 lb. Pt reports pain with lift heavy objects.  Pt has not gone to the  Gym since he has hurt his back. Pt reports he has been resting.  Pt works as a Industrial/product designer.  After reviewing the x-ray, I went back to talk to the patient about his symptoms and he stated that he has had a "summer cold" with coughing at night and tightness in his chest for the last week.  Past Medical History  Diagnosis Date   Thrombocytopenia    No Known Allergies Prior to Admission medications   Not on File   Review of Systems  Musculoskeletal: Positive for back pain.       Objective:   Physical Exam  Nursing note and vitals reviewed. Constitutional: He is oriented to person, place, and time. He appears well-developed and well-nourished. No distress.  HENT:  Head: Normocephalic and atraumatic.  Eyes: EOM are normal.  Neck: Neck supple. No tracheal deviation present.  Cardiovascular: Normal rate and normal heart sounds.   Pulmonary/Chest: Effort normal. No respiratory distress.  Musculoskeletal: Normal range of motion.       Thoracic back: He exhibits no tenderness and no bony tenderness.       Lumbar back: He exhibits no tenderness.  Neurological: He is alert and oriented to person, place, and time.  Skin: Skin is warm  and dry.  Psychiatric: He has a normal mood and affect. His behavior is normal.  [facility] reading (PRIMARY) by  Dr. Joseph Art:  Multiple soft densities in both lung fields, normal T-spine. Cardiomegaly      Assessment & Plan:   Back pain - Plan: DG Thoracic Spine 2 View, DG Chest 2 View Abnormal chest x-ray - Plan: POCT CBC, Comprehensive metabolic panel, POCT SEDIMENTATION RATE, PSA, Angiotensin converting enzyme, CT Chest W Contrast  Back pain - Plan: DG Thoracic Spine 2 View, DG Chest 2 View  Signed, Robyn Haber, MD   Signed, Robyn Haber, MD

## 2013-07-22 NOTE — Addendum Note (Signed)
Addended by: Robyn Haber on: 07/22/2013 04:25 PM   Modules accepted: Orders

## 2013-07-23 ENCOUNTER — Other Ambulatory Visit: Payer: Self-pay | Admitting: Family Medicine

## 2013-07-23 DIAGNOSIS — R9389 Abnormal findings on diagnostic imaging of other specified body structures: Secondary | ICD-10-CM

## 2013-07-23 LAB — COMPREHENSIVE METABOLIC PANEL
ALT: 50 U/L (ref 0–53)
AST: 49 U/L — ABNORMAL HIGH (ref 0–37)
Albumin: 3.6 g/dL (ref 3.5–5.2)
Alkaline Phosphatase: 79 U/L (ref 39–117)
BUN: 11 mg/dL (ref 6–23)
CO2: 28 mEq/L (ref 19–32)
Calcium: 8.8 mg/dL (ref 8.4–10.5)
Chloride: 103 mEq/L (ref 96–112)
Creat: 1.18 mg/dL (ref 0.50–1.35)
Glucose, Bld: 86 mg/dL (ref 70–99)
Potassium: 4.4 mEq/L (ref 3.5–5.3)
Sodium: 137 mEq/L (ref 135–145)
Total Bilirubin: 0.7 mg/dL (ref 0.2–1.2)
Total Protein: 6.3 g/dL (ref 6.0–8.3)

## 2013-07-23 LAB — SEDIMENTATION RATE: Sed Rate: 33 mm/hr — ABNORMAL HIGH (ref 0–16)

## 2013-07-23 LAB — ANGIOTENSIN CONVERTING ENZYME: Angiotensin-Converting Enzyme: 60 U/L — ABNORMAL HIGH (ref 8–52)

## 2013-07-23 LAB — HIV ANTIBODY (ROUTINE TESTING W REFLEX): HIV 1&2 Ab, 4th Generation: NONREACTIVE

## 2013-07-24 LAB — PSA: PSA: 1.27 ng/mL (ref <=4.00)

## 2013-07-25 ENCOUNTER — Inpatient Hospital Stay: Admission: RE | Admit: 2013-07-25 | Payer: Self-pay | Source: Ambulatory Visit

## 2013-07-27 ENCOUNTER — Emergency Department (HOSPITAL_COMMUNITY): Payer: Self-pay

## 2013-07-27 ENCOUNTER — Inpatient Hospital Stay (HOSPITAL_COMMUNITY)
Admission: EM | Admit: 2013-07-27 | Discharge: 2013-07-29 | DRG: 436 | Disposition: A | Discharge status: Home / Self Care | Payer: 59 | Attending: Internal Medicine | Admitting: Internal Medicine

## 2013-07-27 ENCOUNTER — Encounter (HOSPITAL_COMMUNITY): Payer: Self-pay | Admitting: Emergency Medicine

## 2013-07-27 DIAGNOSIS — R52 Pain, unspecified: Secondary | ICD-10-CM | POA: Diagnosis present

## 2013-07-27 DIAGNOSIS — R740 Nonspecific elevation of levels of transaminase and lactic acid dehydrogenase [LDH]: Secondary | ICD-10-CM

## 2013-07-27 DIAGNOSIS — W1809XA Striking against other object with subsequent fall, initial encounter: Secondary | ICD-10-CM | POA: Diagnosis present

## 2013-07-27 DIAGNOSIS — R509 Fever, unspecified: Secondary | ICD-10-CM | POA: Diagnosis present

## 2013-07-27 DIAGNOSIS — R7401 Elevation of levels of liver transaminase levels: Secondary | ICD-10-CM | POA: Diagnosis present

## 2013-07-27 DIAGNOSIS — K769 Liver disease, unspecified: Secondary | ICD-10-CM

## 2013-07-27 DIAGNOSIS — C7952 Secondary malignant neoplasm of bone marrow: Secondary | ICD-10-CM

## 2013-07-27 DIAGNOSIS — Z79899 Other long term (current) drug therapy: Secondary | ICD-10-CM

## 2013-07-27 DIAGNOSIS — C228 Malignant neoplasm of liver, primary, unspecified as to type: Principal | ICD-10-CM | POA: Diagnosis present

## 2013-07-27 DIAGNOSIS — C7951 Secondary malignant neoplasm of bone: Secondary | ICD-10-CM | POA: Diagnosis present

## 2013-07-27 DIAGNOSIS — R7402 Elevation of levels of lactic acid dehydrogenase (LDH): Secondary | ICD-10-CM | POA: Diagnosis present

## 2013-07-27 DIAGNOSIS — Y92009 Unspecified place in unspecified non-institutional (private) residence as the place of occurrence of the external cause: Secondary | ICD-10-CM

## 2013-07-27 DIAGNOSIS — Z7401 Bed confinement status: Secondary | ICD-10-CM

## 2013-07-27 DIAGNOSIS — C7800 Secondary malignant neoplasm of unspecified lung: Secondary | ICD-10-CM | POA: Diagnosis present

## 2013-07-27 DIAGNOSIS — R160 Hepatomegaly, not elsewhere classified: Secondary | ICD-10-CM

## 2013-07-27 LAB — CBC WITH DIFFERENTIAL/PLATELET
BASOS PCT: 0 % (ref 0–1)
Basophils Absolute: 0 10*3/uL (ref 0.0–0.1)
Eosinophils Absolute: 0 10*3/uL (ref 0.0–0.7)
Eosinophils Relative: 0 % (ref 0–5)
HCT: 43.2 % (ref 39.0–52.0)
HEMOGLOBIN: 15.1 g/dL (ref 13.0–17.0)
Lymphocytes Relative: 17 % (ref 12–46)
Lymphs Abs: 1.3 10*3/uL (ref 0.7–4.0)
MCH: 31.4 pg (ref 26.0–34.0)
MCHC: 35 g/dL (ref 30.0–36.0)
MCV: 89.8 fL (ref 78.0–100.0)
MONO ABS: 0.9 10*3/uL (ref 0.1–1.0)
MONOS PCT: 12 % (ref 3–12)
NEUTROS PCT: 71 % (ref 43–77)
Neutro Abs: 5.2 10*3/uL (ref 1.7–7.7)
Platelets: 139 10*3/uL — ABNORMAL LOW (ref 150–400)
RBC: 4.81 MIL/uL (ref 4.22–5.81)
RDW: 13.5 % (ref 11.5–15.5)
WBC: 7.3 10*3/uL (ref 4.0–10.5)

## 2013-07-27 LAB — COMPREHENSIVE METABOLIC PANEL
ALT: 49 U/L (ref 0–53)
AST: 65 U/L — ABNORMAL HIGH (ref 0–37)
Albumin: 2.9 g/dL — ABNORMAL LOW (ref 3.5–5.2)
Alkaline Phosphatase: 94 U/L (ref 39–117)
BUN: 12 mg/dL (ref 6–23)
CO2: 22 mEq/L (ref 19–32)
CREATININE: 1 mg/dL (ref 0.50–1.35)
Calcium: 8.8 mg/dL (ref 8.4–10.5)
Chloride: 101 mEq/L (ref 96–112)
GFR calc Af Amer: >90 mL/min (ref >90)
GFR calc non Af Amer: 89 mL/min — ABNORMAL LOW (ref >90)
Glucose, Bld: 110 mg/dL — ABNORMAL HIGH (ref 70–99)
POTASSIUM: 4.1 meq/L (ref 3.7–5.3)
Sodium: 135 mEq/L — ABNORMAL LOW (ref 137–147)
TOTAL PROTEIN: 6.9 g/dL (ref 6.0–8.3)
Total Bilirubin: 0.7 mg/dL (ref 0.3–1.2)

## 2013-07-27 LAB — TROPONIN I: Troponin I: <0.3 ng/mL (ref <0.30)

## 2013-07-27 LAB — HEPATITIS PANEL, ACUTE
HCV Ab: NEGATIVE
Hep A IgM: NONREACTIVE
Hep B C IgM: NONREACTIVE
Hepatitis B Surface Ag: NEGATIVE

## 2013-07-27 MED ORDER — SODIUM CHLORIDE 0.9 % IV SOLN
INTRAVENOUS | Status: AC
Start: 2013-07-27 — End: 2013-07-27
  Administered 2013-07-27: 06:00:00 via INTRAVENOUS

## 2013-07-27 MED ORDER — ONDANSETRON HCL 4 MG/2ML IJ SOLN: 4.0000 mg | Freq: Four times a day (QID) | INTRAMUSCULAR | Status: DC | PRN

## 2013-07-27 MED ORDER — IOHEXOL 350 MG/ML SOLN
100.0000 mL | Freq: Once | INTRAVENOUS | Status: AC | PRN
  Administered 2013-07-27: 100 mL via INTRAVENOUS

## 2013-07-27 MED ORDER — LORAZEPAM 2 MG/ML IJ SOLN
1.0000 mg | Freq: Every evening | INTRAMUSCULAR | Status: DC | PRN
  Administered 2013-07-28 – 2013-07-29 (×2): 1 mg via INTRAVENOUS
  Filled 2013-07-27 (×2): qty 1

## 2013-07-27 MED ORDER — SODIUM CHLORIDE 0.9 % IJ SOLN: 3.0000 mL | INTRAMUSCULAR | Status: DC | PRN

## 2013-07-27 MED ORDER — ENOXAPARIN SODIUM 40 MG/0.4ML ~~LOC~~ SOLN
40.0000 mg | SUBCUTANEOUS | Status: DC
  Administered 2013-07-27: 40 mg via SUBCUTANEOUS
  Filled 2013-07-27 (×3): qty 0.4

## 2013-07-27 MED ORDER — ONDANSETRON HCL 4 MG PO TABS: 4.0000 mg | ORAL_TABLET | Freq: Four times a day (QID) | ORAL | Status: DC | PRN

## 2013-07-27 MED ORDER — OXYCODONE HCL 5 MG PO TABS
5.0000 mg | ORAL_TABLET | ORAL | Status: DC | PRN
  Administered 2013-07-27: 5 mg via ORAL
  Filled 2013-07-27: qty 1

## 2013-07-27 MED ORDER — SODIUM CHLORIDE 0.9 % IJ SOLN
3.0000 mL | Freq: Two times a day (BID) | INTRAMUSCULAR | Status: DC
  Administered 2013-07-27 – 2013-07-28 (×2): 3 mL via INTRAVENOUS
  Administered 2013-07-28: 22:00:00 via INTRAVENOUS

## 2013-07-27 MED ORDER — LORAZEPAM 2 MG/ML IJ SOLN
1.0000 mg | Freq: Once | INTRAMUSCULAR | Status: AC
  Administered 2013-07-27: 1 mg via INTRAVENOUS
  Filled 2013-07-27: qty 1

## 2013-07-27 MED ORDER — SODIUM CHLORIDE 0.9 % IV SOLN: 250.0000 mL | INTRAVENOUS | Status: DC | PRN

## 2013-07-27 MED ORDER — HYDROMORPHONE HCL PF 1 MG/ML IJ SOLN
1.0000 mg | INTRAMUSCULAR | Status: DC | PRN
  Administered 2013-07-27 – 2013-07-28 (×4): 1 mg via INTRAVENOUS
  Filled 2013-07-27 (×4): qty 1

## 2013-07-27 NOTE — H&P (Signed)
[facility] History and Physical  James Woodard YFV:[id number] DOB: 08-27-1965 DOA: 07/27/2013  Referring physician: ED physician PCP: No primary provider on file.   Chief Complaint: rib pain   HPI:  Pt is 48 yo male who presented to ED with main concern of several days duration of left sided rib pain, started after pt fell in the tub during a shower and says it seems like pain is still there, throbbing and constant, 5/10 in severity, non radiating, no specific alleviating or aggravating factors, no similar events in the past. Pt denies chest pain or shortness of breath, no abdominal or urinary concerns, no fevers, chills, no weight loss. Pt also denies drugs or alcohol, steroid use.   CT angio chest in ED, consistent with hepatocellular carcinoma with lung met's. [facility] asked to admit for further evaluation.   Assessment and Plan: Active Problems: ? Vici with metastatic disease as noted on CT chest - I suspect his anterior ribs pain is related to metastatic disease to T10 vertebral body - will start with obtaining AFP, CEA, CT abd/pelvis, hep panel - if CEA, AFP elevated and Hep panel positive we may not need actual tissue diagnosis - this was discussed with oncologist on call - will obtain official consultation once more data available    Radiological Exams on Admission: Ct Angio Chest Pe W/cm &/or Wo Cm  07/27/2013    1. 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 There is metastatic spread to the intra-abdominal and intrathoracic nodes, bilateral lungs, and T10 vertebral body.  2. 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 Possible main portal vein occlusion. Doppler or Multi phase MRI could further evaluate.  3. Despite repeat imaging, nondiagnostic CTA of the upper lungs. No evidence of pulmonary embolism to the lower lobes.       Code Status: Full Family Communication: Pt at bedside Disposition Plan: Admit  for further evaluation     Review of Systems:  Constitutional: Negative for fever, chills and malaise/fatigue. Negative for diaphoresis.  HENT: Negative for hearing loss, ear pain, nosebleeds, congestion, sore throat, neck pain, tinnitus and ear discharge.   Eyes: Negative for blurred vision, double vision, photophobia, pain, discharge and redness.  Respiratory: Negative for cough, hemoptysis, sputum production, shortness of breath, wheezing and stridor.   Cardiovascular: Negative for chest pain, palpitations, orthopnea, claudication and leg swelling.  Gastrointestinal: Negative for heartburn, constipation, blood in stool and melena.  Genitourinary: Negative for dysuria, urgency, frequency, hematuria and flank pain.  Musculoskeletal: Negative for myalgias, back pain, joint pain and falls.  Skin: Negative for itching and rash.  Neurological: Negative for dizziness and weakness. Negative for tingling, tremors, sensory change, speech change, focal weakness, loss of consciousness and headaches.  Endo/Heme/Allergies: Negative for environmental allergies and polydipsia. Does not bruise/bleed easily.  Psychiatric/Behavioral: Negative for suicidal ideas. The patient is not nervous/anxious.      Past Medical History  Diagnosis Date  . Thrombocytopenia     History reviewed. No pertinent past surgical history.  Social History:  reports that he has never smoked. He does not have any smokeless tobacco history on file. He reports that he does not drink alcohol or use illicit drugs.  No Known Allergies  No known family medical history   Prior to Admission medications   Medication Sig Start Date End Date Taking? Authorizing Provider  diphenhydrAMINE (BENADRYL) 12.5 MG/5ML elixir Take 12.5 mg by mouth daily as needed.   Yes Historical Provider, MD  Physical Exam: Filed Vitals:   07/27/13 0215 07/27/13 0511 07/27/13 0651  BP: 132/80 135/70 138/67  Pulse: 109 96 67  Temp: 99.6 F (37.6 C)  100 F (37.8 C) 99.5 F (37.5 C)  TempSrc: Oral Oral Oral  Resp: _0 Height:   _1  (1.93 m)  Weight:   104.4 kg (230 lb 2.6 oz)  SpO2: 96% 95% 97%    Physical Exam  Constitutional: Appears well-developed and well-nourished. No distress.  HENT: Normocephalic. External right and left ear normal. Oropharynx is clear and moist.  Eyes: Conjunctivae and EOM are normal. PERRLA, no scleral icterus.  Neck: Normal ROM. Neck supple. No JVD. No tracheal deviation. No thyromegaly.  CVS: RRR, S1/S2 +, no murmurs, no gallops, no carotid bruit. Tender anterior chest area, 2 cm below left nipple.  Pulmonary: Effort and breath sounds normal, no stridor, rhonchi, wheezes, rales.  Abdominal: Soft. BS +,  mild distension, no tenderness, rebound or guarding.  Musculoskeletal: Normal range of motion. No edema and no tenderness.  Lymphadenopathy: No lymphadenopathy noted, cervical, inguinal. Neuro: Alert. Normal reflexes, muscle tone coordination. No cranial nerve deficit. Skin: Skin is warm and dry. No rash noted. Not diaphoretic. No erythema. No pallor.  Psychiatric: Normal mood and affect. Behavior, judgment, thought content normal.   Labs on Admission:  Basic Metabolic Panel:  Recent Labs Lab 07/22/13 1709 07/27/13 0325  NA 137 135*  K 4.4 4.1  CL 103 101  CO2 28 22  GLUCOSE 86 110*  BUN 11 12  CREATININE 1.18 1.00  CALCIUM 8.8 8.8   Liver Function Tests:  Recent Labs Lab 07/22/13 1709 07/27/13 0325  AST 49* 65*  ALT 50 49  ALKPHOS 79 94  BILITOT 0.7 0.7  PROT 6.3 6.9  ALBUMIN 3.6 2.9*   CBC:  Recent Labs Lab 07/22/13 1713 07/27/13 0325  WBC 4.8 7.3  NEUTROABS  --  5.2  HGB 15.6 15.1  HCT 48.8 43.2  MCV 97.4* 89.8  PLT  --  139*   Cardiac Enzymes:  Recent Labs Lab 07/27/13 0325  TROPONINI <0.30   EKG: Normal sinus rhythm, no ST/T wave changes  Theodis Blaze, MD  [facility] Pager [phone]  If 7PM-7AM, please contact  night-coverage [email or web address] Password [facility] 07/27/2013, 8:27 AM

## 2013-07-27 NOTE — ED Notes (Signed)
Pt reports sharp shooting pain in his L chest tonight while he was at work.  Pt reports he has been coughing recently.  Reports pain is worse with palpation and with inspiration.

## 2013-07-27 NOTE — ED Notes (Signed)
Patient states that pain is not in the chest but the left anterior rib cage. Pain is greatest on inspiration and described as sharp. He states he is very physically active in the gym and does not know if he has pulled something or not, but breathing is guarded due to pain.

## 2013-07-27 NOTE — Progress Notes (Signed)
Dr Doyle Askew paged via [email or web address] about admission.

## 2013-07-27 NOTE — Progress Notes (Signed)
Patient admitted to room 1304 from ED with new diagnosis of cancer. Oriented to room and unit. VSS. Low grade fever. C/o left rib pain intermittently with movement.

## 2013-07-27 NOTE — Progress Notes (Signed)
Chaplain visited at suggestion of staff. Mr James Woodard received the news that he has cancer early this morning. At present he is still dealing with the news, trying to be strong and trying not to become stressed by the news. He has not slept much since he arrived in the hospital. Mr James Woodard reports that at present he has not told family or friends of the diagnosis pending further tests and a biopsy. It appears he is still in shock and may become more anxious after the news settles in more.   Mr James Woodard is deeply confused at his diagnosis of cancer in the lung. He reports he is not a smoker, does not use tobacco products, and does not work or live in an atmosphere where chemicals linger in the air. He is fit and tries to stay fit through exercise. One stress factor that is rising is that he is a very active person, having to be in the hospital not out doing is taxing. He is retracing his life looking for a logical explanation as to why he worked so hard to avoid cancer and still got the disease.  RECOMMEND: Spiritual Care follow-up after the news of his cancer has settled in a bit more and family/friends have been told and reacted to the news. Page the chaplain should Mr James Woodard's stress levels rise.  Sallee Lange. Daily Crate, D.Min., M.Div. Chaplain

## 2013-07-27 NOTE — ED Provider Notes (Signed)
CSN: [id number]     Arrival date & time 07/27/13  0156 History   First MD Initiated Contact with Patient 07/27/13 0235     Chief Complaint  Patient presents with  . Chest Pain     (Consider location/radiation/quality/duration/timing/severity/associated sxs/prior Treatment) HPI Patient presents for with acute onset left anterior chest pain. Patient says the pain started around 1:45 AM. He was sitting at work described the pain as a shooting pain. The pain is worse with deep breathing or movement. Patient works out frequently and thinks he may have pulled some muscles. He denies any extended travel, recent surgeries, anabolic steroid usage, family history of from embolic disease. He's had no lower extremity swelling or pain. Past Medical History  Diagnosis Date  . Thrombocytopenia    History reviewed. No pertinent past surgical history. No family history on file. History  Substance Use Topics  . Smoking status: Never Smoker   . Smokeless tobacco: Not on file  . Alcohol Use: No    Review of Systems  Constitutional: Negative for fever and chills.  Respiratory: Positive for shortness of breath. Negative for cough.   Cardiovascular: Positive for chest pain. Negative for palpitations and leg swelling.  Gastrointestinal: Negative for nausea, vomiting, abdominal pain and diarrhea.  Musculoskeletal: Negative for back pain, myalgias, neck pain and neck stiffness.  Skin: Negative for rash and wound.  Neurological: Negative for dizziness, weakness, light-headedness, numbness and headaches.  All other systems reviewed and are negative.     Allergies  Review of patient's allergies indicates no known allergies.  Home Medications   Prior to Admission medications   Medication Sig Start Date End Date Taking? Authorizing Provider  diphenhydrAMINE (BENADRYL) 12.5 MG/5ML elixir Take 12.5 mg by mouth daily as needed.   Yes Historical Provider, MD   BP 132/80  Pulse 109  Temp(Src) 99.6 F  (37.6 C) (Oral)  Resp 16  SpO2 96% Physical Exam  Nursing note and vitals reviewed. Constitutional: He is oriented to person, place, and time. He appears well-developed and well-nourished. No distress.  HENT:  Head: Normocephalic and atraumatic.  Mouth/Throat: Oropharynx is clear and moist.  Eyes: EOM are normal. Pupils are equal, round, and reactive to light.  Neck: Normal range of motion. Neck supple.  Cardiovascular: Normal rate and regular rhythm.  Exam reveals no gallop and no friction rub.   No murmur heard. Pulmonary/Chest: Effort normal and breath sounds normal. No respiratory distress. He has no wheezes. He has no rales. He exhibits no tenderness (to reproduce chest tenderness with palpation of the anterior chest.).  Abdominal: Soft. Bowel sounds are normal. He exhibits no distension and no mass. There is no tenderness. There is no rebound and no guarding.  Musculoskeletal: Normal range of motion. He exhibits no edema and no tenderness.  No calf swelling or tenderness.  Neurological: He is alert and oriented to person, place, and time.  Moves all extremities without deficit. Sensation is grossly intact  Skin: Skin is warm and dry. No rash noted. No erythema.  Psychiatric: He has a normal mood and affect. His behavior is normal.    ED Course  Procedures (including critical care time) Labs Review Labs Reviewed  CBC WITH DIFFERENTIAL  COMPREHENSIVE METABOLIC PANEL  TROPONIN I    Imaging Review No results found.   EKG Interpretation   Date/Time:  Saturday Jul 27 2013 02:31:30 EDT Ventricular Rate:  106 PR Interval:  69 QRS Duration: 91 QT Interval:  323 QTC Calculation: 429 R Axis:  78 Text Interpretation:  Sinus tachycardia Probable left atrial enlargement  Borderline T wave abnormalities Confirmed by Annalaura Sauseda  MD, Rainey Kahrs ([id number])  on 07/27/2013 3:06:37 AM      MDM   Final diagnoses:  None    Given sudden nature of chest pain, inability route to  reproduce, tachycardia and low-grade temperature, concern for PE. Will get CT of the chest to continue to monitor. Discuss CT findings with patient. Will admit to inpatient for further workup.   Julianne Rice, MD 07/27/13 [phone]

## 2013-07-27 NOTE — ED Notes (Signed)
Patient transported to CT 

## 2013-07-28 ENCOUNTER — Inpatient Hospital Stay (HOSPITAL_COMMUNITY): Payer: Self-pay

## 2013-07-28 ENCOUNTER — Encounter (HOSPITAL_COMMUNITY): Payer: Self-pay | Admitting: Radiology

## 2013-07-28 ENCOUNTER — Other Ambulatory Visit: Payer: Self-pay | Admitting: Oncology

## 2013-07-28 DIAGNOSIS — C7952 Secondary malignant neoplasm of bone marrow: Secondary | ICD-10-CM

## 2013-07-28 DIAGNOSIS — R079 Chest pain, unspecified: Secondary | ICD-10-CM

## 2013-07-28 DIAGNOSIS — C7951 Secondary malignant neoplasm of bone: Secondary | ICD-10-CM

## 2013-07-28 DIAGNOSIS — C772 Secondary and unspecified malignant neoplasm of intra-abdominal lymph nodes: Secondary | ICD-10-CM

## 2013-07-28 DIAGNOSIS — C228 Malignant neoplasm of liver, primary, unspecified as to type: Principal | ICD-10-CM

## 2013-07-28 DIAGNOSIS — I81 Portal vein thrombosis: Secondary | ICD-10-CM

## 2013-07-28 DIAGNOSIS — R52 Pain, unspecified: Secondary | ICD-10-CM

## 2013-07-28 LAB — APTT: aPTT: 30 seconds (ref 24–37)

## 2013-07-28 LAB — COMPREHENSIVE METABOLIC PANEL
ALBUMIN: 2.7 g/dL — AB (ref 3.5–5.2)
ALT: 47 U/L (ref 0–53)
AST: 56 U/L — ABNORMAL HIGH (ref 0–37)
Alkaline Phosphatase: 88 U/L (ref 39–117)
BILIRUBIN TOTAL: 0.8 mg/dL (ref 0.3–1.2)
BUN: 10 mg/dL (ref 6–23)
CALCIUM: 8.5 mg/dL (ref 8.4–10.5)
CHLORIDE: 101 meq/L (ref 96–112)
CO2: 25 mEq/L (ref 19–32)
CREATININE: 1.13 mg/dL (ref 0.50–1.35)
GFR calc Af Amer: 88 mL/min — ABNORMAL LOW (ref >90)
GFR calc non Af Amer: 76 mL/min — ABNORMAL LOW (ref >90)
Glucose, Bld: 107 mg/dL — ABNORMAL HIGH (ref 70–99)
Potassium: 4.1 mEq/L (ref 3.7–5.3)
Sodium: 137 mEq/L (ref 137–147)
TOTAL PROTEIN: 6.3 g/dL (ref 6.0–8.3)

## 2013-07-28 LAB — CBC
HCT: 43.2 % (ref 39.0–52.0)
Hemoglobin: 14.9 g/dL (ref 13.0–17.0)
MCH: 31.9 pg (ref 26.0–34.0)
MCHC: 34.5 g/dL (ref 30.0–36.0)
MCV: 92.5 fL (ref 78.0–100.0)
PLATELETS: 129 10*3/uL — AB (ref 150–400)
RBC: 4.67 MIL/uL (ref 4.22–5.81)
RDW: 13.8 % (ref 11.5–15.5)
WBC: 7.4 10*3/uL (ref 4.0–10.5)

## 2013-07-28 LAB — AFP TUMOR MARKER: AFP TUMOR MARKER: 57343.6 ng/mL — AB (ref 0.0–8.0)

## 2013-07-28 LAB — PROTIME-INR
INR: 1.41 (ref 0.00–1.49)
PROTHROMBIN TIME: 16.9 s — AB (ref 11.6–15.2)

## 2013-07-28 LAB — CEA: CEA: 1.2 ng/mL (ref 0.0–5.0)

## 2013-07-28 MED ORDER — IOHEXOL 300 MG/ML  SOLN
50.0000 mL | Freq: Once | INTRAMUSCULAR | Status: AC | PRN
  Administered 2013-07-28: 50 mL via ORAL

## 2013-07-28 MED ORDER — IOHEXOL 300 MG/ML  SOLN
100.0000 mL | Freq: Once | INTRAMUSCULAR | Status: AC | PRN
  Administered 2013-07-28: 100 mL via INTRAVENOUS

## 2013-07-28 NOTE — Progress Notes (Signed)
Nutrition Brief Note  Patient identified on the Malnutrition Screening Tool (MST) Report  Wt Readings from Last 15 Encounters:  07/27/13 230 lb 2.6 oz (104.4 kg)  07/22/13 232 lb (105.235 kg)  06/12/13 238 lb (107.956 kg)    Body mass index is 28.03 kg/(m^2). Patient meets criteria for overweight based on current BMI.   Appears normal weight for body frame and structure.  Current diet order is regular, patient is consuming approximately 100% of meals at this time. Labs and medications reviewed.   Noted probably hepatocellular carcinoma with lung met's. Patient states that he usually eats 7-8 times per day.  Lost weight last month after an acute bout of food poisoning that lasted about 1 week.  Discussed meal and snack options here.  No nutrition interventions warranted at this time. If nutrition issues arise, please consult RD.   Antonieta Iba, RD, LDN Clinical Inpatient Dietitian Pager:  [phone] Weekend and after hours pager:  [phone]

## 2013-07-28 NOTE — Plan of Care (Signed)
Problem: Phase I Progression Outcomes Goal: Pain controlled with appropriate interventions Outcome: Progressing Pain in left ribs relieved with IV pain medication,

## 2013-07-28 NOTE — Progress Notes (Signed)
[place]  [phone] [phone]     ID: James Woodard OB: 06-25-1965  James#: [id number]  KCL#:[id number]  PCP: No primary provider on file. GYN:   SU:  OTHER MD: Eppie Gibson  CHIEF COMPLAINT: pain in left rib cage  HISTORY PRESENT ILLNESS: The patient was trying to get into the bathtub at home 07/25/2013 when he slipped and fell, hitting his left rib cage. The pain subsided and he was able to go to the gym the next 2 days. The following day he helped someone carry a 175 lb cooler up some flights of stairs, and the pain came back. This time it was throbbing, and it took him to the ED 07/27/2013 where among other tests a CT angio was obtained, showing innumerable 1-2 cm masses in both lungs and a large liver lesion in the setting of cirrhosis. The patient was admitted and CT scan of the abdomen today showed a 9.6 cm Right liver lobe mass apparently extending to the posterio branch of the R portal vein, with evidence of cirrhosis (varices, splenorenal shunt) and a lesion at T10.  AFP was >5000 with a normal CEA and PSA. Hepatitis panel was negative.  Dr. Doyle Askew contacted Radiation Oncology yesterday. She consulted [place] as the patient and family had many questions they wanted addressed.  INTERVAL HISTORY: I met with James Woodard 07/28/2013. No family was present. A friend stepped outside during our discussion  REVIEW OF SYSTEMS: The left chest wall pain is "like when I had rib s broken before." It is now well-controlled. Denies headaches, visual changes, nausea, vomiting, stiff neck, or balance problems. No cough, phlegm production, pleurisy or SOB. No change in bowel or bladder habits. The patient exercises at the gym most days, 1-2 hours. A detailed review of systems today was otherwise noncontributory.  PAST MEDICAL HISTORY: Past Medical History  Diagnosis Date  . Thrombocytopenia   Splenomegaly  PAST SURGICAL HISTORY: History reviewed. No  pertinent past surgical history.  FAMILY HISTORY History reviewed. No pertinent family history. The patients parents live in [place]. They are in their mid to late 60's. The patient has one full brother, and one half-brother and half-sister. There is no history of cancer in the family.  SOCIAL HISTORY:  The patient worked as a Production manager in the past. He works in [employer] now. At home it's him and his fiance Majel Homer, and their 2 dogs. The patient denies using anabolic steroids at any point.    ADVANCED DIRECTIVES:    HEALTH MAINTENANCE: History  Substance Use Topics  . Smoking status: Never Smoker   . Smokeless tobacco: Not on file  . Alcohol Use: No     Colonoscopy:  PSA :  1.2  Lipid panel:  No Known Allergies  Current Facility-Administered Medications  Medication Dose Route Frequency Provider Last Rate Last Dose  . 0.9 %  sodium chloride infusion  250 mL Intravenous PRN Theodis Blaze, MD      . enoxaparin (LOVENOX) injection 40 mg  40 mg Subcutaneous Q24H Theodis Blaze, MD   40 mg at 07/27/13 1059  . HYDROmorphone (DILAUDID) injection 1 mg  1 mg Intravenous Q2H PRN Theodis Blaze, MD   1 mg at 07/27/13 2157  . LORazepam (ATIVAN) injection 1 mg  1 mg Intravenous QHS PRN Theodis Blaze, MD   1 mg at 07/28/13 0026  . ondansetron (ZOFRAN) tablet 4 mg  4 mg Oral Q6H PRN Theodis Blaze, MD  Or  . ondansetron (ZOFRAN) injection 4 mg  4 mg Intravenous Q6H PRN Theodis Blaze, MD      . oxyCODONE (Oxy IR/ROXICODONE) immediate release tablet 5 mg  5 mg Oral Q4H PRN Theodis Blaze, MD   5 mg at 07/27/13 1408  . sodium chloride 0.9 % injection 3 mL  3 mL Intravenous Q12H Theodis Blaze, MD   3 mL at 07/28/13 1139  . sodium chloride 0.9 % injection 3 mL  3 mL Intravenous PRN Theodis Blaze, MD        OBJECTIVE: middle aged African American male who appears healthy Filed Vitals:   07/28/13 0536  BP: 130/75  Pulse: 103  Temp: 100.6 F (38.1 C)  Resp: 24     Body mass index is  28.03 kg/(m^2).    ECOG FS:1 - Symptomatic but completely ambulatory  Ocular: Sclerae unicteric, EOMs intact Ear-nose-throat: Oropharynx clear and moist Lymphatic: No cervical or supraclavicular adenopathy Lungs no rales or rhonchi, good excursion bilaterally Heart regular rate and rhythm, no murmur appreciated Abd muscular, nontender, positive bowel sounds MSK no focal spinal tenderness, no joint edema Neuro: non-focal, well-oriented, appropriate affect  LAB RESULTS: Results for James, Woodard (MRN [id number]) as of 07/28/2013 14:45  Ref. Range 07/22/2013 17:09 07/27/2013 09:39  Hep A IgM Latest Range: NON REACTIVE   NON REACTIVE  Hepatitis B Surface Ag Latest Range: NEGATIVE   NEGATIVE  Hep B C IgM Latest Range: NON REACTIVE   NON REACTIVE  HCV Ab Latest Range: NEGATIVE   NEGATIVE  HIV Latest Range: NONREACTIVE  NONREACTIVE    Results for James, Woodard (MRN [id number]) as of 07/28/2013 14:45  Ref. Range 07/22/2013 17:09 07/27/2013 09:39  AFP-Tumor Marker Latest Range: 0.0-8.0 ng/mL  57343.6 (H)  CEA Latest Range: 0.0-5.0 ng/mL  1.2  PSA Latest Range: <=4.00 ng/mL 1.27    CMP     Component Value Date/Time   NA 137 07/28/2013 0405   K 4.1 07/28/2013 0405   CL 101 07/28/2013 0405   CO2 25 07/28/2013 0405   GLUCOSE 107* 07/28/2013 0405   BUN 10 07/28/2013 0405   CREATININE 1.13 07/28/2013 0405   CREATININE 1.18 07/22/2013 1709   CALCIUM 8.5 07/28/2013 0405   PROT 6.3 07/28/2013 0405   ALBUMIN 2.7* 07/28/2013 0405   AST 56* 07/28/2013 0405   ALT 47 07/28/2013 0405   ALKPHOS 88 07/28/2013 0405   BILITOT 0.8 07/28/2013 0405   GFRNONAA 76* 07/28/2013 0405   GFRAA 88* 07/28/2013 0405    I No results found for this basename: SPEP, UPEP,  kappa and lambda light chains    Lab Results  Component Value Date   WBC 7.4 07/28/2013   NEUTROABS 5.2 07/27/2013   HGB 14.9 07/28/2013   HCT 43.2 07/28/2013   MCV 92.5 07/28/2013   PLT 129* 07/28/2013    '@LASTCHEMISTRY'$ @  No results found  for this basename: LABCA2    No components found with this basename: LABCA125    No results found for this basename: INR,  in the last 168 hours  Urinalysis No results found for this basename: colorurine, appearanceur, labspec, phurine, glucoseu, hgbur, bilirubinur, ketonesur, proteinur, urobilinogen, nitrite, leukocytesur    STUDIES: Ct Abdomen Pelvis W Wo Contrast  07/28/2013   CLINICAL DATA:  Suspected hepatocellular carcinoma with lung metastases on CTA chest  EXAM: CT ABDOMEN AND PELVIS WITHOUT AND WITH CONTRAST  TECHNIQUE: Multidetector CT imaging of the abdomen and pelvis was performed following the  standard protocol before and following the bolus administration of intravenous contrast.  CONTRAST:  127mL OMNIPAQUE IOHEXOL 300 MG/ML  SOLN  COMPARISON:  Partial comparison CTA chest dated 07/27/2013  FINDINGS: Suboptimal contrast opacification.  Motion degraded images.  Innumerable pulmonary metastases.  Cirrhosis. Ill-defined 8.5 x 9.6 cm mass in the posterior segment right hepatic lobe, highly suspicious for hepatocellular carcinoma. Enhancing tumor within the posterior branch of the right portal vein is suspected (series 3/ image 27).  However, the main portal vein, while poorly opacified, likely remains patent (series 5/ images 37, 45, and 56).  Mild splenomegaly, measuring 14.9 cm in maximal dimension.  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 shunt.  No abdominopelvic ascites.  Pancreas and adrenal glands are within normal limits.  Gallbladder is notable for layering gallstones (series 5/image 50) and possible mild gallbladder wall thickening. No intrahepatic or extrahepatic ductal dilatation.  Kidneys are within normal limits.  No hydronephrosis.  No evidence of abdominal aortic aneurysm.  No suspicious abdominopelvic lymphadenopathy.  Prostate is unremarkable.  Bladder is within normal limits.  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   IMPRESSION: 9.6 cm mass in the posterior segment hepatic lobe, highly suspicious for hepatocellular carcinoma.  Suspected enhancing tumor within the posterior branch of the right portal vein. However, the main portal vein remains patent.  Cirrhosis. Stigmata of portal hypertension, including splenomegaly and gastroesophageal/gastrosplenic varices.  Cholelithiasis.  Innumerable pulmonary metastases.  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   Electronically Signed   By: Julian Hy M.D.   On: 07/28/2013 11:54   Dg Chest 2 View  07/22/2013   CLINICAL DATA:  Back pain.  EXAM: CHEST  2 VIEW  COMPARISON:  There are innumerable soft tissue masses throughout both lungs. Heart size and pulmonary vascularity are normal. No effusions. There is no appreciable hilar or mediastinal adenopathy. No discrete bone lesions.  FINDINGS: Extensive bilateral pulmonary nodules worrisome for metastatic disease. Sarcoidosis could also give this appearance. The findings were discussed with Dr. Joseph Art by Dr. Zigmund Daniel.  IMPRESSION: No active cardiopulmonary disease.   Electronically Signed   By: Rozetta Nunnery M.D.   On: 07/22/2013 16:18   Dg Thoracic Spine 2 View  07/22/2013   CLINICAL DATA:  Back pain.  EXAM: THORACIC SPINE - 2 VIEW  COMPARISON:  None.  FINDINGS: No bone destruction or fracture. There are slight degenerative osteophytes in the lower thoracic spine.  There are innumerable pulmonary nodules bilaterally worrisome for metastatic disease or sarcoidosis.  IMPRESSION: No significant abnormality of the thoracic spine. Multiple pulmonary nodules.   Electronically Signed   By: Rozetta Nunnery M.D.   On: 07/22/2013 16:19   Ct Angio Chest Pe W/cm &/or Wo Cm  07/27/2013   CLINICAL DATA:  Left chest pain  EXAM: CT ANGIOGRAPHY CHEST WITH CONTRAST  TECHNIQUE: Multidetector CT imaging of the chest was performed using the standard protocol during bolus administration of intravenous contrast. Multiplanar CT image reconstructions and MIPs  were obtained to evaluate the vascular anatomy.  CONTRAST:  175mL OMNIPAQUE IOHEXOL 350 MG/ML SOLN  COMPARISON:  Chest x-ray from 5 days prior  FINDINGS: THORACIC INLET/BODY WALL:  No acute abnormality.  MEDIASTINUM:  Normal heart size. No pericardial effusion. Nondiagnostic examination for exclusion of pulmonary embolism secondary to bolus dispersion, causing poor opacification of the upper lung the pulmonary arterial tree. There is good opacification to the lower lobes, where there is no visible embolus. Repeat bolus in imaging was performed, with unchanged results. No acute  aortic abnormality.  Lower mediastinal lymphadenopathy, with node or node conglomerate measuring up to 24 mm and deforming the lower esophagus. This is in continuity with gastrohepatic ligament lymphadenopathy.  LUNG WINDOWS:  Innumerable smoothly contoured pulmonary nodules randomly distributed throughout the bilateral lungs. Most individual nodules measure 1-2 cm in diameter. No cavitation, effusion, or air leak. No pulmonary edema.  UPPER ABDOMEN:  Partly imaged liver shows typical changes of cirrhosis with lobulated liver margins and caudate lobe hypertrophy. There is a large heterogeneous mass arising from the right lobe of the liver, measuring at least 10 cm in size. There are enlarged arteries within the mass, shunting to the venous system. Heterogeneous arterial dense at enhancement present within the portal system of the right lobe, especially posterior segment. The main portal vein is not opacified, however the intrahepatic portal venous system is. There is splenomegaly. Lymphadenopathy present in the gastrohepatic ligament.  OSSEOUS:  Ill-defined Lytic lesion in the T10 vertebral body measuring 14 mm. No acute fracture.  Review of the MIP images confirms the above findings.  IMPRESSION: 1. 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 There is metastatic spread to the intra-abdominal and  intrathoracic nodes, bilateral lungs, and T10 vertebral body. 2. 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 Possible main portal vein occlusion. Doppler or Multi phase MRI could further evaluate. 3. Despite repeat imaging, nondiagnostic CTA of the upper lungs. No evidence of pulmonary embolism to the lower lobes.   Electronically Signed   By: Jorje Guild M.D.   On: 07/27/2013 05:04    ASSESSMENT: 48 y.o.  man with a 10 cm Right liver mass in the presence of cirrhosis, with an AFP of 5734 (and normal CEA and PSA); with evidence of metastases to both lungs, T10, and gastrohepatic lymph nodes, as well as possible involvement of the Right portal vein; with negative hepatitis serologies and no hx of anabolic steroid abuse  DX: stage IV hepatocellular carcinoma  Other problems:   Left rib cage pain, s/p recent trauma  PLAN: I met with James Woodard for approximately 45 minutes, orienting him to the nature of cancer in general, and hepatocellular carcinoma in particular. He understands stage IV hepatoma is unfortunately not curable. The goal of treatment is control. Chemotherapy, targeted agents such as serafenib, and other possibilities are to be discussed after his case is presented at GI conference 07/31/2013 and he meets with Dr Benay Spice, our GI malignancies specialist. Radiation and surgery have very limited roles at this point.The patient is interested in clinical trials if available and in seeking a second opinion at [facility] or [facility] at Dr Ashok Woodard discretion.  I do not feel liver biopsy is necessary to confirm the diagnosis. I will send PT and PTT and if unremarkable would discharge the patient on prophylactic dose lovenox. I am copying Dr Benay Spice on this note; his navigator has already been alerted to the case.  Please let me know if I can be of further help.  Chauncey Cruel, MD   07/28/2013 2:11 PM

## 2013-07-28 NOTE — Progress Notes (Signed)
Patient ID: James Woodard, male   DOB: [birth year], 48 y.o.   MRN: [id number]  [facility] PROGRESS NOTE  James Woodard BTY:[id number] DOB: 03-Jun-1965 DOA: 07/27/2013 PCP: No primary provider on file.  Brief narrative: Pt is 48 yo male who presented to ED with main concern of several days duration of left sided rib pain, started after pt fell in the tub during a shower and says it seems like pain is still there, throbbing and constant, 5/10 in severity, non radiating, no specific alleviating or aggravating factors, no similar events in the past. Pt denies chest pain or shortness of breath, no abdominal or urinary concerns, no fevers, chills, no weight loss. Pt also denies drugs or alcohol, steroid use.   CT angio chest in ED, consistent with hepatocellular carcinoma with lung met's. [facility] asked to admit for further evaluation.   Assessment and Plan:  Active Problems:  HCC with metastatic disease to lungs and bones - pt reports feeling better this am but still very confused with the findings on the scans as he says he feels fine overall and can not believe he has cancer  - AFP > 57,000, CT abd and pelvis essentially confirming HCC with multiple lung metastases - I have discussed the care with Dr. Jana Woodard and we appreciate his input and assistance, will follow up on recommendations  - regarding lytic lesion in T10, I have discussed this finding with Dr. Isidore Woodard (radiation oncology), they will see pt in AM to discuss palliative radiation to the area - I have discussed the findings on the available scans and lab results, I also offered emotional support to pt and family in this very difficult time  ? Tumor thrombus within the right portal venous system with possible main portal vein occlusion - after review of CT scan with radiologist, it was determined that no portal vein occlusion  is present and therefore no need for anticoagulation at this time (Discussed with Dr.  Cristina Woodard) Fever - unknown etiology, pt denies any specific respiratory symptoms, no urinary concerns - possibly related to malignancy but again this is unclear - will ask for UA, urine and blood culture  - will hold off on ABX for now until clear cause established  Mild transaminitis - possibly related to principal problem   Consultants:  Oncology  Radiation oncology   Procedures/Studies: Ct Abdomen Pelvis W Wo Contrast  07/28/2013   9.6 cm mass in the posterior segment hepatic lobe, highly suspicious for hepatocellular carcinoma.  Suspected enhancing tumor within the posterior branch of the right portal vein. However, the main portal vein remains patent.  Cirrhosis. Stigmata of portal hypertension, including splenomegaly and gastroesophageal/gastrosplenic varices.  Cholelithiasis.  Innumerable pulmonary metastases.  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  Ct Angio Chest Pe W/cm &/or Wo Cm  07/27/2013   1. 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 There is metastatic spread to the intra-abdominal and intrathoracic nodes, bilateral lungs, and T10 vertebral body. 2. 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 Possible main portal vein occlusion. Doppler or Multi phase MRI could further evaluate. 3. Despite repeat imaging, nondiagnostic CTA of the upper lungs. No evidence of pulmonary embolism to the lower lobes.    Antibiotics:  None  Code Status: Full Family Communication: Pt and fiance at bedside Disposition Plan: Home when medically stable  HPI/Subjective: No events overnight.   Objective: Filed Vitals:   07/27/13 1435 07/27/13 2110 07/27/13 2150 07/28/13 0536  BP: 143/81 145/80  130/75  Pulse: 88 105 106 103  Temp: 97.9 F (36.6 C) 99.9 F (37.7 C)  100.6 F (38.1 C)  TempSrc: Oral Oral  Oral  Resp: _0 Height:      Weight:      SpO2: 99% 96%  93%    Intake/Output Summary (Last 24 hours) at 07/28/13 1234 Last  data filed at 07/28/13 0700  Gross per 24 hour  Intake   2362 ml  Output      0 ml  Net   2362 ml    Exam:   General:  Pt is alert, follows commands appropriately, not in acute distress  Cardiovascular: Regular rate and rhythm, S1/S2, no murmurs, no rubs, no gallops  Respiratory: Clear to auscultation bilaterally, no wheezing, no crackles, no rhonchi  Abdomen: Soft, tender in upper abdominal quadrant with deeper palpation, non distended, bowel sounds present, no guarding  Extremities: pulses DP and PT palpable bilaterally  Neuro: Grossly nonfocal  Data Reviewed: Basic Metabolic Panel:  Recent Labs Lab 07/22/13 1709 07/27/13 0325 07/28/13 0405  NA 137 135* 137  K 4.4 4.1 4.1  CL 103 101 101  CO2 _1 GLUCOSE 86 110* 107*  BUN _2 CREATININE 1.18 1.00 1.13  CALCIUM 8.8 8.8 8.5   Liver Function Tests:  Recent Labs Lab 07/22/13 1709 07/27/13 0325 07/28/13 0405  AST 49* 65* 56*  ALT 50 49 47  ALKPHOS 79 94 88  BILITOT 0.7 0.7 0.8  PROT 6.3 6.9 6.3  ALBUMIN 3.6 2.9* 2.7*   CBC:  Recent Labs Lab 07/22/13 1713 07/27/13 0325 07/28/13 0405  WBC 4.8 7.3 7.4  NEUTROABS  --  5.2  --   HGB 15.6 15.1 14.9  HCT 48.8 43.2 43.2  MCV 97.4* 89.8 92.5  PLT  --  139* 129*   Cardiac Enzymes:  Recent Labs Lab 07/27/13 0325  TROPONINI <0.30   Scheduled Meds: . enoxaparin (LOVENOX) injection  40 mg Subcutaneous Q24H  . sodium chloride  3 mL Intravenous Q12H   Continuous Infusions:    Theodis Blaze, MD  [facility] Pager [phone]  If 7PM-7AM, please contact night-coverage [email or web address] Password [email or web address] 07/28/2013, 12:34 PM   LOS: 1 day

## 2013-07-29 ENCOUNTER — Telehealth: Payer: Self-pay | Admitting: Oncology

## 2013-07-29 ENCOUNTER — Other Ambulatory Visit: Payer: Self-pay | Admitting: Oncology

## 2013-07-29 ENCOUNTER — Encounter: Payer: Self-pay | Admitting: Radiation Oncology

## 2013-07-29 ENCOUNTER — Ambulatory Visit
Admit: 2013-07-29 | Discharge: 2013-07-29 | Disposition: A | Discharge status: Home / Self Care | Payer: 59 | Attending: Radiation Oncology | Admitting: Radiation Oncology

## 2013-07-29 DIAGNOSIS — C220 Liver cell carcinoma: Secondary | ICD-10-CM

## 2013-07-29 DIAGNOSIS — C7800 Secondary malignant neoplasm of unspecified lung: Secondary | ICD-10-CM

## 2013-07-29 MED ORDER — OXYCODONE HCL 5 MG PO TABS: 5.0000 mg | ORAL_TABLET | ORAL | Status: AC | PRN

## 2013-07-29 NOTE — Progress Notes (Unsigned)
James Woodard has an appt with hanna sanoff at [facility] 6/11 at 1 PM; he was refused an appt at [facility] because he has no insurance. However he says he does have insurance (?)-- the patient has the phone # of Dr Skeet Simmer office and if he wishes to be seen at [facility] as well he will call and schedule it himslef.

## 2013-07-29 NOTE — Discharge Summary (Signed)
Physician Discharge Summary  James Woodard JGO:[id number] DOB: 17-Jan-1966 DOA: 07/27/2013  PCP: No primary provider on file.  Admit date: 07/27/2013 Discharge date: 07/29/2013  Recommendations for Outpatient Follow-up:  1. Pt will need to follow up with PCP in 2-3 weeks post discharge 2. Pt referred to [facility] for second opinion 3. Dr. Jana Hakim made referral and will also make referral to [place] if pt has difficulty with insurance   Discharge Diagnoses: Metastatic liver cancer  Active Problems:   Lung metastases   Pain  Discharge Condition: Stable  Diet recommendation: Heart healthy diet discussed in details   History of present illness:  Brief narrative:  Pt is 48 yo male who presented to ED with main concern of several days duration of left sided rib pain, started after pt fell in the tub during a shower and says it seems like pain is still there, throbbing and constant, 5/10 in severity, non radiating, no specific alleviating or aggravating factors, no similar events in the past. Pt denies chest pain or shortness of breath, no abdominal or urinary concerns, no fevers, chills, no weight loss. Pt also denies drugs or alcohol, steroid use.   CT angio chest in ED, consistent with hepatocellular carcinoma with lung met's. [facility] asked to admit for further evaluation.   Assessment and Plan:  Active Problems:  HCC with metastatic disease to lungs and bones  - pt reports feeling better this am but still very confused with the findings on the scans as he says he feels fine overall and can not believe he has cancer  - AFP > 57,000, CT abd and pelvis essentially confirming HCC with multiple lung metastases  - I have discussed the care with Dr. Jana Hakim and we appreciate his input and assistance, pt referred to [facility] for second opinion  - regarding lytic lesion in T10, I have discussed this finding with Dr. Isidore Moos (radiation oncology), no need for radiation therapy  - I have  discussed the findings on the available scans and lab results, I also offered emotional support to pt and family in this very difficult time  ? Tumor thrombus within the right portal venous system with possible main portal vein occlusion  - after review of CT scan with radiologist, it was determined that no portal vein occlusion is present and therefore no need for anticoagulation at this time (Discussed with Dr. Cristina Gong)  Fever  - unknown etiology, pt denies any specific respiratory symptoms, no urinary concerns  - possibly related to malignancy but again this is unclear  - resolved over the past 24 hours  - will hold off on ABX for now  Mild transaminitis  - possibly related to principal problem   Consultants:  Oncology  Radiation oncology  Procedures/Studies:  Ct Abdomen Pelvis W Wo Contrast 07/28/2013 9.6 cm mass in the posterior segment hepatic lobe, highly suspicious for hepatocellular carcinoma. Suspected enhancing tumor within the posterior branch of the right portal vein. However, the main portal vein remains patent. Cirrhosis. Stigmata of portal hypertension, including splenomegaly and gastroesophageal/gastrosplenic varices. Cholelithiasis. Innumerable pulmonary metastases. 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  Ct Angio Chest Pe W/cm &/or Wo Cm 07/27/2013 1. 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 There is metastatic spread to the intra-abdominal and intrathoracic nodes, bilateral lungs, and T10 vertebral body. 2. 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 Possible main portal vein occlusion. Doppler or Multi phase MRI could further evaluate. 3. Despite repeat imaging, nondiagnostic CTA  of the upper lungs. No evidence of pulmonary embolism to the lower lobes.  Antibiotics:  None  Code Status: Full  Family Communication: Pt and fiance at bedside  Disposition Plan: Home  Discharge Exam: Filed Vitals:   07/29/13 0615  BP:  126/78  Pulse: 96  Temp: 98.7 F (37.1 C)  Resp: 24   Filed Vitals:   07/28/13 0536 07/28/13 1425 07/28/13 2120 07/29/13 0615  BP: 130/75 136/85 137/73 126/78  Pulse: 103 108 96 96  Temp: 100.6 F (38.1 C) 99.3 F (37.4 C) 99.3 F (37.4 C) 98.7 F (37.1 C)  TempSrc: Oral Oral Oral Oral  Resp: _0 Height:      Weight:      SpO2: 93% 97% 94% 95%    General: Pt is alert, follows commands appropriately, not in acute distress Cardiovascular: Regular rate and rhythm, S1/S2 +, no murmurs, no rubs, no gallops Respiratory: Clear to auscultation bilaterally, no wheezing, no crackles, no rhonchi Abdominal: Soft, non tender, non distended, bowel sounds +, no guarding Extremities: no edema, no cyanosis, pulses palpable bilaterally DP and PT Neuro: Grossly nonfocal  Discharge Instructions  Discharge Instructions   Diet - low sodium heart healthy    Complete by:  As directed      Increase activity slowly    Complete by:  As directed             Medication List    STOP taking these medications       diphenhydrAMINE 12.5 MG/5ML elixir  Commonly known as:  BENADRYL      TAKE these medications       oxyCODONE 5 MG immediate release tablet  Commonly known as:  Oxy IR/ROXICODONE  Take 1 tablet (5 mg total) by mouth every 4 (four) hours as needed for moderate pain.           Follow-up Information   Follow up with Faye Ramsay, MD. (As needed, If symptoms worsen, call my cell phone [phone])    Specialty:  Internal Medicine   Contact information:   [address] [place] [address] [phone]        The results of significant diagnostics from this hospitalization (including imaging, microbiology, ancillary and laboratory) are listed below for reference.     Microbiology: No results found for this or any previous visit (from the past 240 hour(s)).   Labs: Basic Metabolic Panel:  Recent Labs Lab 07/22/13 1709 07/27/13 0325  07/28/13 0405  NA 137 135* 137  K 4.4 4.1 4.1  CL 103 101 101  CO2 _1 GLUCOSE 86 110* 107*  BUN _2 CREATININE 1.18 1.00 1.13  CALCIUM 8.8 8.8 8.5   Liver Function Tests:  Recent Labs Lab 07/22/13 1709 07/27/13 0325 07/28/13 0405  AST 49* 65* 56*  ALT 50 49 47  ALKPHOS 79 94 88  BILITOT 0.7 0.7 0.8  PROT 6.3 6.9 6.3  ALBUMIN 3.6 2.9* 2.7*   No results found for this basename: LIPASE, AMYLASE,  in the last 168 hours No results found for this basename: AMMONIA,  in the last 168 hours CBC:  Recent Labs Lab 07/22/13 1713 07/27/13 0325 07/28/13 0405  WBC 4.8 7.3 7.4  NEUTROABS  --  5.2  --   HGB 15.6 15.1 14.9  HCT 48.8 43.2 43.2  MCV 97.4* 89.8 92.5  PLT  --  139* 129*   Cardiac Enzymes:  Recent Labs Lab 07/27/13 0325  TROPONINI <0.30   BNP: BNP (last 3 results) No results found for this basename: PROBNP,  in the last 8760 hours CBG: No results found for this basename: GLUCAP,  in the last 168 hours   SIGNED: Time coordinating discharge: Over 30 minutes  Theodis Blaze, MD  [facility] 07/29/2013, 10:50 AM Pager [phone]  If 7PM-7AM, please contact night-coverage [email or web address] Password [email or web address]

## 2013-07-29 NOTE — Telephone Encounter (Signed)
C/D 07/29/13 for appt. 09/18/13

## 2013-07-29 NOTE — Consult Note (Signed)
Radiation Oncology         [phone] ________________________________  Initial inpatient Consultation  Name: James Woodard MRN: [id number]  Date: 07/27/2013  DOB: [birth year]  CC:  Lurline Del, MD  Mart Piggs, MD  REFERRING PHYSICIAN: Mart Piggs, MD  DIAGNOSIS: Putative Hepatocellular Carcinoma  HISTORY OF PRESENT ILLNESS::James Woodard is a 48 y.o. 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 He was in his usual state of health when last week, he fell in the bathtub and hit his left chest on the bathtub. He developed pain in the rib cage above the left nipple. This pain improved and he had a fairly strenuous work out within 48 hours thereafter. However, recently, with some heavy lifting, the pain resumed in his left upper chest. He went to the emergency room and CT angiogram revealed multiple lesions in his chest consistent with fulminant pulmonary metastases. He also had a large >9 cm right liver lesion suspicious for hepatocellular carcinoma. He then underwent CT of the abdomen and pelvis. I spoke with Dr. Doyle Askew and she reports that she has been in touch with radiology and they have determined upon further review of his imaging that no interventions need to be made for the portal vasculature. Upon review, parenthetically I been told that the vasculature or appears adequately patent.   His AFP was tested and was over 50000. He is otherwise asymptomatic. He says that he could run 3 miles without any difficulty and he has been walking up and down the hallway of the hospital and doing pushups since his admission. He has seen Dr Jana Hakim of medical oncology and reports that he has a pending consultation at [facility] for discussion of systemic therapies. His imaging was also notable for a small lesion at the anterior right aspect of the T10 vertebral body. He denies any back pain. He reports that his left upper chest rib pain has improved  since admission to the hospital. His fiance and her parents are present  for part of the consultation today. They are in the process of planning their wedding for October.  PREVIOUS RADIATION THERAPY: Yes   PAST MEDICAL HISTORY:  has a past medical history of Thrombocytopenia.    PAST SURGICAL HISTORY:History reviewed. No pertinent past surgical history.  FAMILY HISTORY: family history is not on file.  SOCIAL HISTORY:  reports that he has never smoked. He does not have any smokeless tobacco history on file. He reports that he does not drink alcohol or use illicit drugs.  ALLERGIES: Review of patient's allergies indicates no known allergies.  MEDICATIONS:  Current Facility-Administered Medications  Medication Dose Route Frequency Provider Last Rate Last Dose  . 0.9 %  sodium chloride infusion  250 mL Intravenous PRN Theodis Blaze, MD      . enoxaparin (LOVENOX) injection 40 mg  40 mg Subcutaneous Q24H Theodis Blaze, MD   40 mg at 07/27/13 1059  . HYDROmorphone (DILAUDID) injection 1 mg  1 mg Intravenous Q2H PRN Theodis Blaze, MD   1 mg at 07/28/13 1446  . LORazepam (ATIVAN) injection 1 mg  1 mg Intravenous QHS PRN Theodis Blaze, MD   1 mg at 07/29/13 0117  . ondansetron (ZOFRAN) tablet 4 mg  4 mg Oral Q6H PRN Theodis Blaze, MD       Or  . ondansetron [facility]) injection 4 mg  4 mg Intravenous Q6H PRN Theodis Blaze, MD      .  oxyCODONE (Oxy IR/ROXICODONE) immediate release tablet 5 mg  5 mg Oral Q4H PRN Theodis Blaze, MD   5 mg at 07/27/13 1408  . sodium chloride 0.9 % injection 3 mL  3 mL Intravenous Q12H Theodis Blaze, MD      . sodium chloride 0.9 % injection 3 mL  3 mL Intravenous PRN Theodis Blaze, MD        REVIEW OF SYSTEMS:  Notable for that above.   PHYSICAL EXAM:  height is 6\' 4"  (1.93 m) and weight is 230 lb 2.6 oz (104.4 kg). His oral temperature is 98.7 F (37.1 C). His blood pressure is 126/78 and his pulse is 96. His respiration is 24 and oxygen saturation is 95%.     Well-appearing gentleman in no acute distress. Alert and oriented and conversant. He has no tenderness to palpation throughout the entire vertebral column.  James Woodard = 0  0 - Asymptomatic (Fully active, able to carry on all predisease activities without restriction)  1 - Symptomatic but completely ambulatory (Restricted in physically strenuous activity but ambulatory and able to carry out work of a light or sedentary nature. For example, light housework, office work)  2 - Symptomatic, <50% in bed during the day (Ambulatory and capable of all self care but unable to carry out any work activities. Up and about more than 50% of waking hours)  3 - Symptomatic, >50% in bed, but not bedbound (Capable of only limited self-care, confined to bed or chair 50% or more of waking hours)  4 - Bedbound (Completely disabled. Cannot carry on any self-care. Totally confined to bed or chair)  5 - Death   Eustace Pen MM, Creech RH, Tormey DC, et al. [phone]). "Toxicity and response criteria of the [facility]". Remer Oncol. 5 (6): 649-55   LABORATORY DATA:  Lab Results  Component Value Date   WBC 7.4 07/28/2013   HGB 14.9 07/28/2013   HCT 43.2 07/28/2013   MCV 92.5 07/28/2013   PLT 129* 07/28/2013   CMP     Component Value Date/Time   NA 137 07/28/2013 0405   K 4.1 07/28/2013 0405   CL 101 07/28/2013 0405   CO2 25 07/28/2013 0405   GLUCOSE 107* 07/28/2013 0405   BUN 10 07/28/2013 0405   CREATININE 1.13 07/28/2013 0405   CREATININE 1.18 07/22/2013 1709   CALCIUM 8.5 07/28/2013 0405   PROT 6.3 07/28/2013 0405   ALBUMIN 2.7* 07/28/2013 0405   AST 56* 07/28/2013 0405   ALT 47 07/28/2013 0405   ALKPHOS 88 07/28/2013 0405   BILITOT 0.8 07/28/2013 0405   GFRNONAA 76* 07/28/2013 0405   GFRAA 88* 07/28/2013 0405    Lab Results  Component Value Date   HEPAIGM NON REACTIVE 07/27/2013   HEPBIGM NON REACTIVE 07/27/2013   07-27-13: HCV ab - NEGATIVE 07-22-13: HIV - NONREACTIVE  07-27-13: AFP -  [id number].  RADIOGRAPHY: Ct Abdomen Pelvis W Wo Contrast  07/28/2013   CLINICAL DATA:  Suspected hepatocellular carcinoma with lung metastases on CTA chest  EXAM: CT ABDOMEN AND PELVIS WITHOUT AND WITH CONTRAST  TECHNIQUE: Multidetector CT imaging of the abdomen and pelvis was performed following the standard protocol before and following the bolus administration of intravenous contrast.  CONTRAST:  172mL OMNIPAQUE IOHEXOL 300 MG/ML  SOLN  COMPARISON:  Partial comparison CTA chest dated 07/27/2013  FINDINGS: Suboptimal contrast opacification.  Motion degraded images.  Innumerable pulmonary metastases.  Cirrhosis. Ill-defined 8.5 x 9.6 cm mass in the  posterior segment right hepatic lobe, highly suspicious for hepatocellular carcinoma. Enhancing tumor within the posterior branch of the right portal vein is suspected (series 3/ image 27).  However, the main portal vein, while poorly opacified, likely remains patent (series 5/ images 37, 45, and 56).  Mild splenomegaly, measuring 14.9 cm in maximal dimension.  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 shunt.  No abdominopelvic ascites.  Pancreas and adrenal glands are within normal limits.  Gallbladder is notable for layering gallstones (series 5/image 50) and possible mild gallbladder wall thickening. No intrahepatic or extrahepatic ductal dilatation.  Kidneys are within normal limits.  No hydronephrosis.  No evidence of abdominal aortic aneurysm.  No suspicious abdominopelvic lymphadenopathy.  Prostate is unremarkable.  Bladder is within normal limits.  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  IMPRESSION: 9.6 cm mass in the posterior segment hepatic lobe, highly suspicious for hepatocellular carcinoma.  Suspected enhancing tumor within the posterior branch of the right portal vein. However, the main portal vein remains patent.  Cirrhosis. Stigmata of portal hypertension, including splenomegaly and  gastroesophageal/gastrosplenic varices.  Cholelithiasis.  Innumerable pulmonary metastases.  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   Electronically Signed   By: Julian Hy M.D.   On: 07/28/2013 11:54   Dg Chest 2 View  07/22/2013   CLINICAL DATA:  Back pain.  EXAM: CHEST  2 VIEW  COMPARISON:  There are innumerable soft tissue masses throughout both lungs. Heart size and pulmonary vascularity are normal. No effusions. There is no appreciable hilar or mediastinal adenopathy. No discrete bone lesions.  FINDINGS: Extensive bilateral pulmonary nodules worrisome for metastatic disease. Sarcoidosis could also give this appearance. The findings were discussed with Dr. Joseph Art by Dr. Zigmund Daniel.  IMPRESSION: No active cardiopulmonary disease.   Electronically Signed   By: Rozetta Nunnery M.D.   On: 07/22/2013 16:18   Dg Thoracic Spine 2 View  07/22/2013   CLINICAL DATA:  Back pain.  EXAM: THORACIC SPINE - 2 VIEW  COMPARISON:  None.  FINDINGS: No bone destruction or fracture. There are slight degenerative osteophytes in the lower thoracic spine.  There are innumerable pulmonary nodules bilaterally worrisome for metastatic disease or sarcoidosis.  IMPRESSION: No significant abnormality of the thoracic spine. Multiple pulmonary nodules.   Electronically Signed   By: Rozetta Nunnery M.D.   On: 07/22/2013 16:19   Ct Angio Chest Pe W/cm &/or Wo Cm  07/27/2013   CLINICAL DATA:  Left chest pain  EXAM: CT ANGIOGRAPHY CHEST WITH CONTRAST  TECHNIQUE: Multidetector CT imaging of the chest was performed using the standard protocol during bolus administration of intravenous contrast. Multiplanar CT image reconstructions and MIPs were obtained to evaluate the vascular anatomy.  CONTRAST:  113mL OMNIPAQUE IOHEXOL 350 MG/ML SOLN  COMPARISON:  Chest x-ray from 5 days prior  FINDINGS: THORACIC INLET/BODY WALL:  No acute abnormality.  MEDIASTINUM:  Normal heart size. No pericardial effusion. Nondiagnostic examination for exclusion of  pulmonary embolism secondary to bolus dispersion, causing poor opacification of the upper lung the pulmonary arterial tree. There is good opacification to the lower lobes, where there is no visible embolus. Repeat bolus in imaging was performed, with unchanged results. No acute aortic abnormality.  Lower mediastinal lymphadenopathy, with node or node conglomerate measuring up to 24 mm and deforming the lower esophagus. This is in continuity with gastrohepatic ligament lymphadenopathy.  LUNG WINDOWS:  Innumerable smoothly contoured pulmonary nodules randomly distributed throughout the bilateral lungs. Most individual nodules measure 1-2 cm in diameter. No  cavitation, effusion, or air leak. No pulmonary edema.  UPPER ABDOMEN:  Partly imaged liver shows typical changes of cirrhosis with lobulated liver margins and caudate lobe hypertrophy. There is a large heterogeneous mass arising from the right lobe of the liver, measuring at least 10 cm in size. There are enlarged arteries within the mass, shunting to the venous system. Heterogeneous arterial dense at enhancement present within the portal system of the right lobe, especially posterior segment. The main portal vein is not opacified, however the intrahepatic portal venous system is. There is splenomegaly. Lymphadenopathy present in the gastrohepatic ligament.  OSSEOUS:  Ill-defined Lytic lesion in the T10 vertebral body measuring 14 mm. No acute fracture.  Review of the MIP images confirms the above findings.  IMPRESSION: 1. 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 There is metastatic spread to the intra-abdominal and intrathoracic nodes, bilateral lungs, and T10 vertebral body. 2. 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 Possible main portal vein occlusion. Doppler or Multi phase MRI could further evaluate. 3. Despite repeat imaging, nondiagnostic CTA of the upper lungs. No evidence of pulmonary embolism  to the lower lobes.   Electronically Signed   By: Jorje Guild M.D.   On: 07/27/2013 05:04      IMPRESSION/PLAN: This is a lovely 48 year old gentleman, James Woodard performance status is 0. Putative hepatocellular carcinoma.  We discussed him at our brain and spine tumor board this morning. His images were reviewed. The consensus was that if he is asymptomatic at the T10 level, where he has what appears to be a osseous metastasis, that radiotherapy /  radiosurgery is not needed. I do not think that his left-sided chest pain is associated with this lesion but more likely associated with his recent trauma. He has no back pain. This lesion is fortunately relatively far from the spinal canal and nerves exiting spine. For these reasons I do not recommend palliative radiotherapy or radiosurgery at this time. I do not see any other indications for radiotherapy at this time either. I told the patient and his fiance that it is best to prioritize systemic chemotherapy /or immunotherapy. He is enthusiastic about his referral to [facility] for further discussion. I have added him on for the GI tumor board at [facility] this June 3, as well. I will sign off from his case, but am happy to see him on an as-needed basis should any needs arise for consideration of radiotherapy in the future.      I spent 30 minutes  face to face with the patient and more than 50% of that time was spent in counseling and/or coordination of care.    __________________________________________   Eppie Gibson, MD

## 2013-07-29 NOTE — Progress Notes (Signed)
I met with James Woodard, his girlfriend, and her parents this morning. I discussed with him that we feel the best physician for him to see as far as his hepatocellular carcinoma his concern is Dr. Clelia Croft at [facility] I gave him Dr. Margarette Canada appointments number which is [phone] and I told him we would call Dr. Margarette Canada office and try to get an appointment for him within the next week or 2.  Hepatoma turns out not to be part of the GI group and therefore I will continue to follow the patient in [place]. I will make him a followup appointment with me in July.  McCaffrey knows to call for any problems that may develop before his visit with me and also if he has not heard from Dr. Gwenette Greet office in the next few days.  Please let me know if I can be of further help at this point.

## 2013-07-29 NOTE — Telephone Encounter (Signed)
CALLED PATIENT ROOM [id number] AND GAVE NP APPT FOR 07/22 @ 4 W/DR. MAGRINAT.  [place] PACKET MAILED.

## 2013-07-29 NOTE — Progress Notes (Signed)
Patient was stable at time of discharge. IV removed. Reviewed discharge education with patient. She verbalized understanding and had no further questions. Patient left with prescription in hand.

## 2013-07-29 NOTE — Discharge Instructions (Signed)
Liver Cancer °Cells divide to form new cells when the body needs them. That happens in the liver, just as it does in other organs. Sometimes, the cells divide too rapidly. Old cells do not die off, and the process gets out of control. A growth (tumor) forms. That is how liver cancer starts. °The liver is an important organ of the body. It is located on the upper right side of the belly (abdomen), just below the ribs. It is the largest organ in the body. In an adult man, it is about the size of a football. The liver stores sugar and iron. It also cleans (filters) harmful substances out of the blood. °CAUSES  °Scientists do not know exactly why cells start to divide rapidly in the liver to form a tumor. It is known that certain behaviors and conditions (risk factors) make liver cancer more likely to develop. They include: °· Being male. Men older than 50 have liver cancer more often than other people do. °· Having scarring of the liver (cirrhosis). °· This occurs when liver cells are damaged and replaced with scar tissue. °· Heavy drinking of alcohol for many years can cause cirrhosis, as well as being infected with the hepatitis B or hepatitis C virus (HBV or HCV). HBV and HCV are spread through blood and sexual contact. °· Having certain liver diseases, such as: °· Hemochromatosis. This disease causes too much iron to be stored in the liver. °· Autoimmune hepatitis. In this disease, the body's immune system turns against the liver. °· Wilson's disease. This disease happens when copper builds up in the liver. °· Having diabetes, particularly if you also drink alcohol heavily or have chronic viral hepatitis. °· Being overweight (obese). °· Exposure to alfatoxins. These are substances made by certain types of mold. They can form on peanuts, corn, wheat, soybeans, and rice. This is not a common cause of liver cancer in the [place] and [place] where foods are tested for alfatoxins. °SYMPTOMS  °Often times, liver  cancer often has few symptoms in the beginning. Sometimes, there are none. As the cancer grows, symptoms may include: °· Weight loss without dieting. °· Loss of appetite. °· Nausea. °· Feeling very weak and tired. °· Pain on the right side of the belly (abdomen). °· Feeling full or bloated. °· Running a fever for no reason. °· The skin or eyes become yellow in color (jaundice). °· Dark-colored urine. °DIAGNOSIS  °Your caregiver may suspect that you have liver cancer based on your symptoms and your physical exam. Other tests will likely be necessary. These may include: °· Blood tests, including a test called alpha-fetoprotein (AFP). The blood contains more of this protein if someone has liver cancer. °· Imaging tests. These tests may be able to detect cancer or a tumor. These tests include CT scan, MRI scan, or ultrasound. °· Liver biopsy. Using medicine to numb your skin, a specialist can insert a needle into the liver and remove some cells. These cells are examined under a microscope to determine if cancer is present. This is the best way to be certain of the diagnosis. °TREATMENT  °Liver cancer can be treated many ways. The type of treatment will depend on: °· The stage of the cancer. °· Your age and overall health. °· The condition of the liver. This means how well it is working and whether cirrhosis is present. °Treatments can be used alone or in combination. Options include: °· Surgery. °· The part of the liver that has cancer   may be removed. °· The whole liver may be removed. It would be replaced with a healthy liver (liver transplant). °· Radiation. High-energy X-rays kill the cancer cells. °· External radiation beams rays from a machine outside the body. °· Internal radiation uses tiny spheres (like beads) that are put into the body. They give off radiation. °· Chemotherapy. This treatment uses drugs that kill cancer cells. Options include: °· Intravenous chemotherapy. Drugs are put into the blood to travel  through the body. °· Targeted chemotherapy. This uses a drug that kills liver cancer by blocking the cancer's blood supply. °· Chemoembolization. Drugs are injected directly into the liver to kill cancer cells. °· Cryoablation. This involves killing the cancer cells by freezing them. °· Radiofrequency ablation. This procedure kills the cancer cells by heating them with an electric current. °HOME CARE INSTRUCTIONS  °· Learn as much as you can about liver cancer. It is important to be an informed patient. °· Work closely with your caregivers. Fighting cancer takes a team approach. °· Take medicines for pain only as prescribed by your caregiver. Follow the directions carefully. Ask before taking any over-the-counter drugs. Be aware that medicines containing acetaminophen can add to liver damage. °· Pay attention to what you eat. Nutrition is an important part of recovering from liver cancer. Having this cancer can take away your appetite. So can some of the treatments for it. You might need to limit salt. It is also important to get the right amount of protein. Ask your caregiver for advice or talk with a nutritionist. °· Consider joining a support group. Learning to live with a serious health problem, such as liver cancer, can be difficult. Friends and family can help. Many people also find it helpful to talk with others who are going through the same things you are. Ask your caregiver for a list of groups in your area. °· Get rest. °· Do not drink alcoholic beverages at all. °· Get vaccinated against hepatitis A and hepatitis B. There is no vaccine for hepatitis C. If you are considered at risk for these diseases, get tested. °SEEK MEDICAL CARE IF:  °· You cannot eat because you feel sick to your stomach. °· Your abdomen or legs start to swell. Fluid might be building up. °· You feel weaker or more tired than usual. °· Your pain gets worse. °SEEK IMMEDIATE MEDICAL CARE IF:  °· Your pain in your abdomen increases  suddenly. °· You have nausea, vomiting, or diarrhea that does not go away. °· You feel confused. °· You feel very sleepy during the daytime. °· You have any bleeding that does not stop quickly. °· You have a fever. °Document Released: 07/03/2008 Document Revised: 05/09/2011 Document Reviewed: 07/03/2008 °ExitCare® Patient Information ©2014 [employer]. ° °

## 2013-07-31 ENCOUNTER — Encounter: Payer: Self-pay | Admitting: *Deleted

## 2013-08-06 ENCOUNTER — Encounter (HOSPITAL_COMMUNITY): Payer: Self-pay | Admitting: Emergency Medicine

## 2013-08-06 ENCOUNTER — Emergency Department (HOSPITAL_COMMUNITY)
Admission: EM | Admit: 2013-08-06 | Discharge: 2013-08-06 | Disposition: A | Discharge status: Home / Self Care | Payer: 59 | Attending: Emergency Medicine | Admitting: Emergency Medicine

## 2013-08-06 DIAGNOSIS — Z85118 Personal history of other malignant neoplasm of bronchus and lung: Secondary | ICD-10-CM | POA: Diagnosis not present

## 2013-08-06 DIAGNOSIS — R05 Cough: Secondary | ICD-10-CM | POA: Insufficient documentation

## 2013-08-06 DIAGNOSIS — Z862 Personal history of diseases of the blood and blood-forming organs and certain disorders involving the immune mechanism: Secondary | ICD-10-CM | POA: Insufficient documentation

## 2013-08-06 DIAGNOSIS — Z8505 Personal history of malignant neoplasm of liver: Secondary | ICD-10-CM | POA: Insufficient documentation

## 2013-08-06 DIAGNOSIS — R059 Cough, unspecified: Secondary | ICD-10-CM | POA: Insufficient documentation

## 2013-08-06 DIAGNOSIS — M549 Dorsalgia, unspecified: Secondary | ICD-10-CM

## 2013-08-06 HISTORY — DX: Malignant neoplasm of liver, not specified as primary or secondary: C22.9

## 2013-08-06 HISTORY — DX: Secondary malignant neoplasm of unspecified lung: C78.00

## 2013-08-06 NOTE — ED Notes (Signed)
Dr. Jacubowitz at the bedside 

## 2013-08-06 NOTE — ED Notes (Signed)
Pt. reports left back pain worse with deep inspiration onset this evening . Denies cough or congestion , seen at [facility] last week diagnosed with lung metastasis / liver cancer .

## 2013-08-06 NOTE — ED Notes (Signed)
Patient and guest are requesting to be transported to [facility] because staff is familiar with patient's current health status.  Discussed with them that a conversation with the ED Physician here is required before decision such as that can be made.  Advised them to be upfront about the reasons for transfer request when the doctor rounds.

## 2013-08-06 NOTE — Discharge Instructions (Signed)
Keep your scheduled appointment with the liver specialist later today and with your specialist at [facility] later this week. Call any of the numbers on the resource guide or the wellness Center to get primary care physician.  Emergency Department Resource Guide 1) Find a Doctor and Pay Out of Pocket Although you won't have to find out who is covered by your insurance plan, it is a good idea to ask around and get recommendations. You will then need to call the office and see if the doctor you have chosen will accept you as a new patient and what types of options they offer for patients who are self-pay. Some doctors offer discounts or will set up payment plans for their patients who do not have insurance, but you will need to ask so you aren't surprised when you get to your appointment.  2) Contact Your Local Health Department Not all health departments have doctors that can see patients for sick visits, but many do, so it is worth a call to see if yours does. If you don't know where your local health department is, you can check in your phone book. The CDC also has a tool to help you locate your state's health department, and many state websites also have listings of all of their local health departments.  3) Find a [facility] If your illness is not likely to be very severe or complicated, you may want to try a walk in clinic. These are popping up all over the country in pharmacies, drugstores, and shopping centers. They're usually staffed by nurse practitioners or physician assistants that have been trained to treat common illnesses and complaints. They're usually fairly quick and inexpensive. However, if you have serious medical issues or chronic medical problems, these are probably not your best option.  No Primary Care Doctor: - Call Health Connect at  [phone] - they can help you locate a primary care doctor that  accepts your insurance, provides certain services, etc. - Physician  Referral Service- [phone]  Chronic Pain Problems: Organization         Address  Phone   Notes  [facility]  [phone] Patients need to be referred by their primary care doctor.   Medication Assistance: Organization         Address  Phone   Notes  [facility] Medication [facility] [address] [place], [address] [address] [phone] --Must be a resident of [facility] -- Must have NO insurance coverage whatsoever (no Medicaid/ Medicare, etc.) -- The pt. MUST have a primary care doctor that directs their care regularly and follows them in the community   MedAssist  [phone]   [employer]  [phone]    Agencies that provide inexpensive medical care: Organization         Address  Phone   Notes  Ali Chukson  [phone]   [facility]    [phone]   [facility] [place], [place] [address] [phone]   [place] [address] [place] [phone]   Planned Parenthood    [phone]   [facility]    [phone]   [place] and [address], [place] Phone:  [phone], Fax:  [phone] Hours of Operation:  9 am - 6 pm, M-F.  Also accepts Medicaid/Medicare and self-pay.  [facility] School for [place], [address], [place] Phone: [phone], Fax: [phone]. Hours of Operation:  8:30 am - 5:30 pm, M-F.  Also accepts Medicaid and self-pay.  [facility] [address] [place] Phone: [phone]   [place], [place], [place] [phone], Ext. [phone] Mondays & Thursdays: 7-9 AM.  First 15 patients are seen on a first come, first serve basis.    [place] Providers:  Organization         Address  Phone   Notes  [facility] [address], [place] [phone] Also accepts self-pay patients.  [facility] [address], [place]  [phone]   [place], [address], [place] [phone]   [facility] [address], [place] [phone]   Lucianne Lei [address], [place]   [phone] Only accepts [place] Access [place] patients after they have their name applied to their card.   Self-Pay (no insurance) in [facility]:  Organization         Address  Phone   Notes  Sickle Cell Patients, [facility] Internal Medicine [place] [phone]   [facility] Urgent Care [place] [phone]   [facility] [place]  [place], [place], [place] [phone]   [facility]/Dr. Osei-Bonsu  [address] [place] or [address], [place] [phone] Phone number for both [place] and [place] locations is the same.  Urgent Medical and [facility] [address] [place] [phone]   [facility] [address] [place] or [address] [phone]   [facility] [address] [place] [phone], phone; [phone], fax Sees patients 1st and 3rd Saturday of every month.  Must not qualify for public or private insurance (i.e. Medicaid, Medicare, [place] Health Choice, Veterans' Benefits)  Household income should be no more than 200% of the poverty level The clinic cannot treat you if you are pregnant or think you are pregnant  Sexually transmitted diseases are not treated at the clinic.    Dental Care: Organization         Address  Phone  Notes  [facility] Department of [facility] [place] [phone] Accepts children up to age 84 who are enrolled  in [place] or [place]; pregnant women with a Medicaid card; and children who have applied for Medicaid or [place] Health Choice, but were declined, whose parents can pay a reduced fee at time of service.  [facility] Department of [facility]  [address], [place] [phone] Accepts children up to age 33 who are enrolled in [place] or [place]; pregnant women with a Medicaid card; and children who have applied for Medicaid or [place] Health Choice, but were declined, whose parents can pay a reduced fee at time of service.  [place] Adult Dental Access PROGRAM  [place] [phone] Patients are seen by appointment only. Walk-ins are not accepted. [place] will see patients 49 years of age and older. Monday - Tuesday (8am-5pm) Most Wednesdays (8:30-5pm) $30 per  visit, cash only  [facility] Adult [employer] PROGRAM  [address], [facility] [phone] Patients are seen by appointment only. Walk-ins are not accepted. [place] will see patients 75 years of age and older. One Wednesday Evening (Monthly: Volunteer Based).  $30 per visit, cash only  [place]  [phone] for adults; Children under age 64, call Graduate Pediatric Dentistry at [phone]. Children aged 33-14, please call [phone] to request a pediatric application.  Dental services are provided in all areas of dental care including fillings, crowns and bridges, complete and partial dentures, implants, gum treatment, root canals, and extractions. Preventive care is also provided. Treatment is provided to both adults and children. Patients are selected via a lottery and there is often a waiting list.   [facility] [address], [place]  [phone] [email or web address]   [facility] [address] [place], [place] [phone], Ext. [phone] Second and Fourth Thursday of each month, opens at  6:30 AM; Clinic ends at 9 AM.  Patients are seen on a first-come first-served basis, and a limited number are seen during each clinic.   [facility]  [address] Hillard Danker [place], [place] [phone]   Eligibility Requirements You must have lived in [place], [place], or [place] counties for at least the last three months.   You cannot be eligible for state or federal sponsored [employer], including [employer], [place], or [employer].   You generally cannot be eligible for healthcare insurance through your employer.    How to apply: Eligibility screenings are held every Tuesday and Wednesday afternoon from 1:00 pm until 4:00 pm. You do not need an appointment for the interview!  [facility] [address], [place], [place]   [place]  [place] Department  [place]  [phone]    Behavioral Health Resources in the Community: Intensive Outpatient Programs Organization         Address  Phone  Notes  [place]ac [place]. [address] [place], [place] [phone]   [facility] Outpatient [address], [place], [place]   ADS: Alcohol & Drug Svcs [address], [place], [place]   [place] [address]  [place], [place] or [phone]   Substance Abuse Resources Organization         Address  Phone  Notes  Alcohol and Drug Services  [phone]   [place]  [phone]   The [place]   Chinita Pester  [phone]   Residential & Outpatient Substance Abuse Program  [phone]   Psychological Services Organization         Address  Phone  Notes  [facility]  [place]  [phone]   [place] [address], [place] or [phone]    Mobile Crisis Teams Organization         Address  Phone  Notes  [facility], Mobile Crisis Care Unit  [phone]   [facility] Psychotherapeutic Services  [address] [place], [place]   [place] Levels [address] [place] [phone]    Self-Help/Support Groups Organization         Address  Phone  Notes  [facility]ental [facility]oc. of [place] - variety of support groups  [place] Call for more information  Narcotics Anonymous (NA), [facility]aring [facility] [address], [employer] [place]  2 meetings at this location   Special educational needs teacher         Address  Phone  Notes  [facility] Residential Treatment [place],    [place]  [phone]   [facility]  [address], [place] [phone], [place], [place]   [place], [place] [phone] Admissions: 8am-3pm M-F  Incentives Substance [place] [address],    [place], [place] [phone]   [facility] [address] [place], [place], [place]   [facility] [address],  [place], [place]   [facility] Programs - Intensive Outpatient [address], [place], [place]   [facility] [place].,  [place], [place] [phone] or [phone]   [facility]idential Treatment Services (RTS) [address] [place], [place] Accepts Medicaid  [facility] [place] [address],  [place] [phone] Substance Abuse/Addiction Treatment   [facility] Organization         Address  Phone  Notes  [facility]  [phone]   Domenic Schwab, PhD [address] Arlis [place], [place]   [phone] or [phone]   Wright Hollister B[place], [place] [phone]   [facility]  [address] [place], [place] [phone] Insurance/Medicaid/sponsorship through [facility] and Families [address], [address] [place]                                    [place], [place] [phone] [place] [address], [place] [phone]    Dr. Adele Schilder  [phone]   [facility] Dept. 1) [address], [place] 2) [place] 3)  [place] [address], [place] [phone]  [phone]   [place] [phone] or [phone] (After Hours)

## 2013-08-06 NOTE — ED Provider Notes (Signed)
CSN: [id number]     Arrival date & time 08/06/13  0251 History   First MD Initiated Contact with Patient 08/06/13 [phone]     Chief Complaint  Patient presents with  . Back Pain     (Consider location/radiation/quality/duration/timing/severity/associated sxs/prior Treatment) HPI     Complained of right-sided subscapular nonradiating back pain onset 1:30 AM today pain resolved at 3:15 AM after he treated himself with one oxy IR tablets 5 mg. He is presently asymptomatic pain was worse with cough he denies shortness of breath denies fever denies other associated symptoms Past Medical History  Diagnosis Date  . Thrombocytopenia   . Lung metastasis   . Liver cancer    History reviewed. No pertinent past surgical history. No family history on file. History  Substance Use Topics  . Smoking status: Never Smoker   . Smokeless tobacco: Not on file  . Alcohol Use: No    Review of Systems  Constitutional: Negative.   HENT: Negative.   Respiratory: Positive for cough.   Cardiovascular: Negative.   Gastrointestinal: Negative.   Musculoskeletal: Positive for back pain.  Skin: Negative.   Neurological: Negative.   Psychiatric/Behavioral: Negative.   All other systems reviewed and are negative.     Allergies  Review of patient's allergies indicates no known allergies.  Home Medications   Prior to Admission medications   Medication Sig Start Date End Date Taking? Authorizing Provider  oxyCODONE (OXY IR/ROXICODONE) 5 MG immediate release tablet Take 1 tablet (5 mg total) by mouth every 4 (four) hours as needed for moderate pain. 07/29/13  Yes Theodis Blaze, MD   BP 135/89  Pulse 106  Temp(Src) 98.3 F (36.8 C) (Oral)  Resp 27  SpO2 93% Physical Exam  Nursing note and vitals reviewed. Constitutional: He appears well-developed and well-nourished.  HENT:  Head: Normocephalic and atraumatic.  Eyes: Conjunctivae are normal. Pupils are equal, round, and reactive to light.  Neck: Neck  supple. No tracheal deviation present. No thyromegaly present.  Cardiovascular: Normal rate and regular rhythm.   No murmur heard. Pulmonary/Chest: Effort normal and breath sounds normal.  Abdominal: Soft. Bowel sounds are normal. He exhibits no distension. There is no tenderness.  Musculoskeletal: Normal range of motion. He exhibits no edema and no tenderness.  Neurological: He is alert. Coordination normal.  Skin: Skin is warm and dry. No rash noted.  Psychiatric: He has a normal mood and affect.    ED Course  Procedures (including critical care time) Labs Review Labs Reviewed - No data to display  Imaging Review No results found.   EKG Interpretation   Date/Time:  Tuesday August 06 2013 03:01:33 EDT Ventricular Rate:  105 PR Interval:  148 QRS Duration: 87 QT Interval:  305 QTC Calculation: 403 R Axis:   98 Text Interpretation:  Sinus tachycardia Borderline right axis deviation  Repol abnrm suggests ischemia, lateral leads No significant change since  last tracing Confirmed by Winfred Leeds  MD, Rithika Seel ([id number]) on 08/06/2013 5:30:16  AM      MDM  Doubt acute pe. Pt asymptomaatic after tx with oxy ir 1 tablet.  denies sob  Final diagnoses:  None   Patient has appointment with her liver specialist [facility] at [facility]. He also has an appointment with oncologist in 2 days at [facility] No further ED workup needed. He will be given copies of his CT scan from 07/27/2013 Diagnosis back pain    Orlie Dakin, MD 08/06/13 [phone]

## 2013-08-09 ENCOUNTER — Other Ambulatory Visit: Payer: Self-pay | Admitting: Oncology

## 2013-08-09 NOTE — Progress Notes (Unsigned)
I was called today by Dr. Burt Ek who did see James Woodard in consultation for his hepatocellular carcinoma. It appears that Mr. Macon has had hepatocellular carcinoma diagnosed in the past and in fact underwent doxorubicin DD at embolization at [facility] under Arsenio Katz sometime ago. He was considered for transplant at the time, apparently. The patient had concealed is information from his fiance and certainly from me as well. [facility] will be participating in a study protocol there combining doxorubicin and sorafenib.

## 2013-08-11 ENCOUNTER — Other Ambulatory Visit: Payer: Self-pay | Admitting: Oncology

## 2013-08-13 ENCOUNTER — Encounter: Payer: Self-pay | Admitting: Internal Medicine

## 2013-09-18 ENCOUNTER — Other Ambulatory Visit: Payer: Self-pay

## 2013-09-18 ENCOUNTER — Ambulatory Visit: Payer: Self-pay | Admitting: Oncology

## 2013-09-18 ENCOUNTER — Ambulatory Visit: Payer: Self-pay

## 2013-10-29 DEATH — deceased

## 2015-06-14 IMAGING — CR DG THORACIC SPINE 2V
4 series · 4 of 4 positions shown · non-contrast
Comparison: None.

CLINICAL DATA: Back pain.

EXAM:
THORACIC SPINE - 2 VIEW

[AP (1 of 3)]
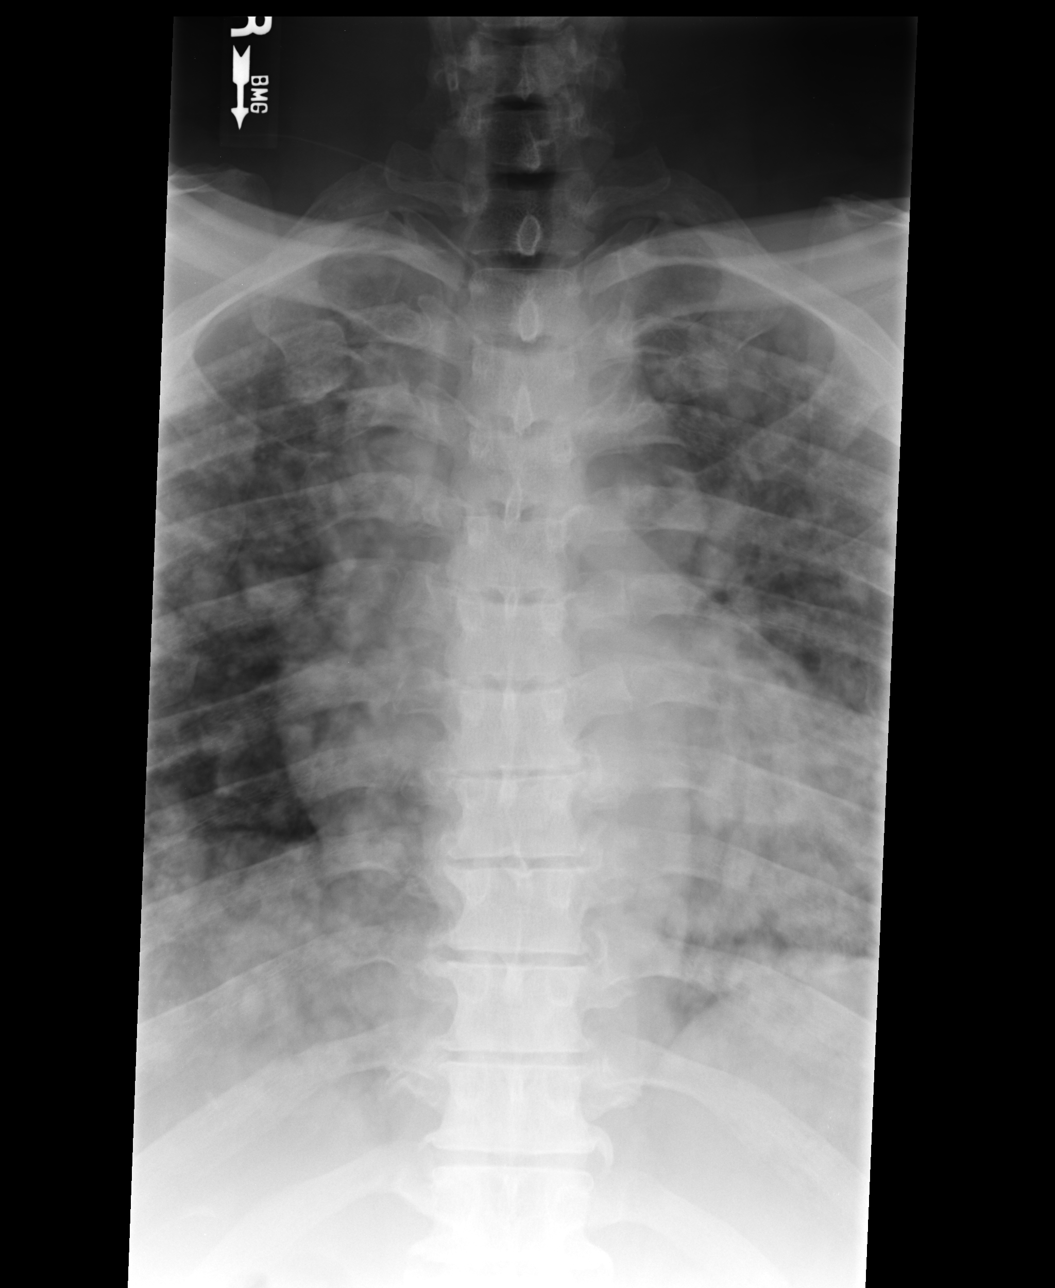

[lateral]
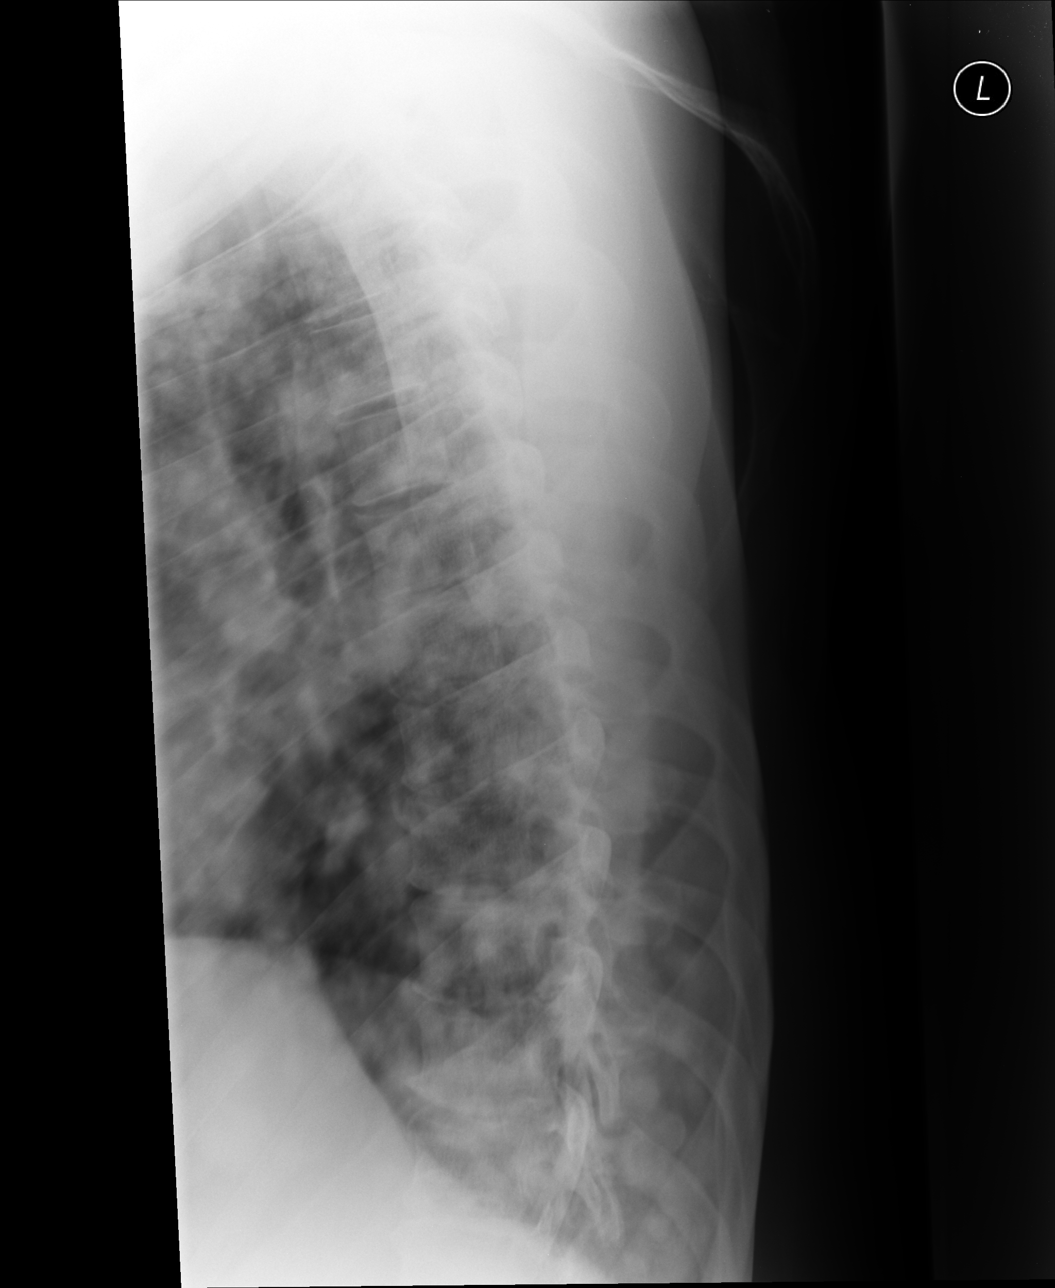

[AP (2 of 3)]
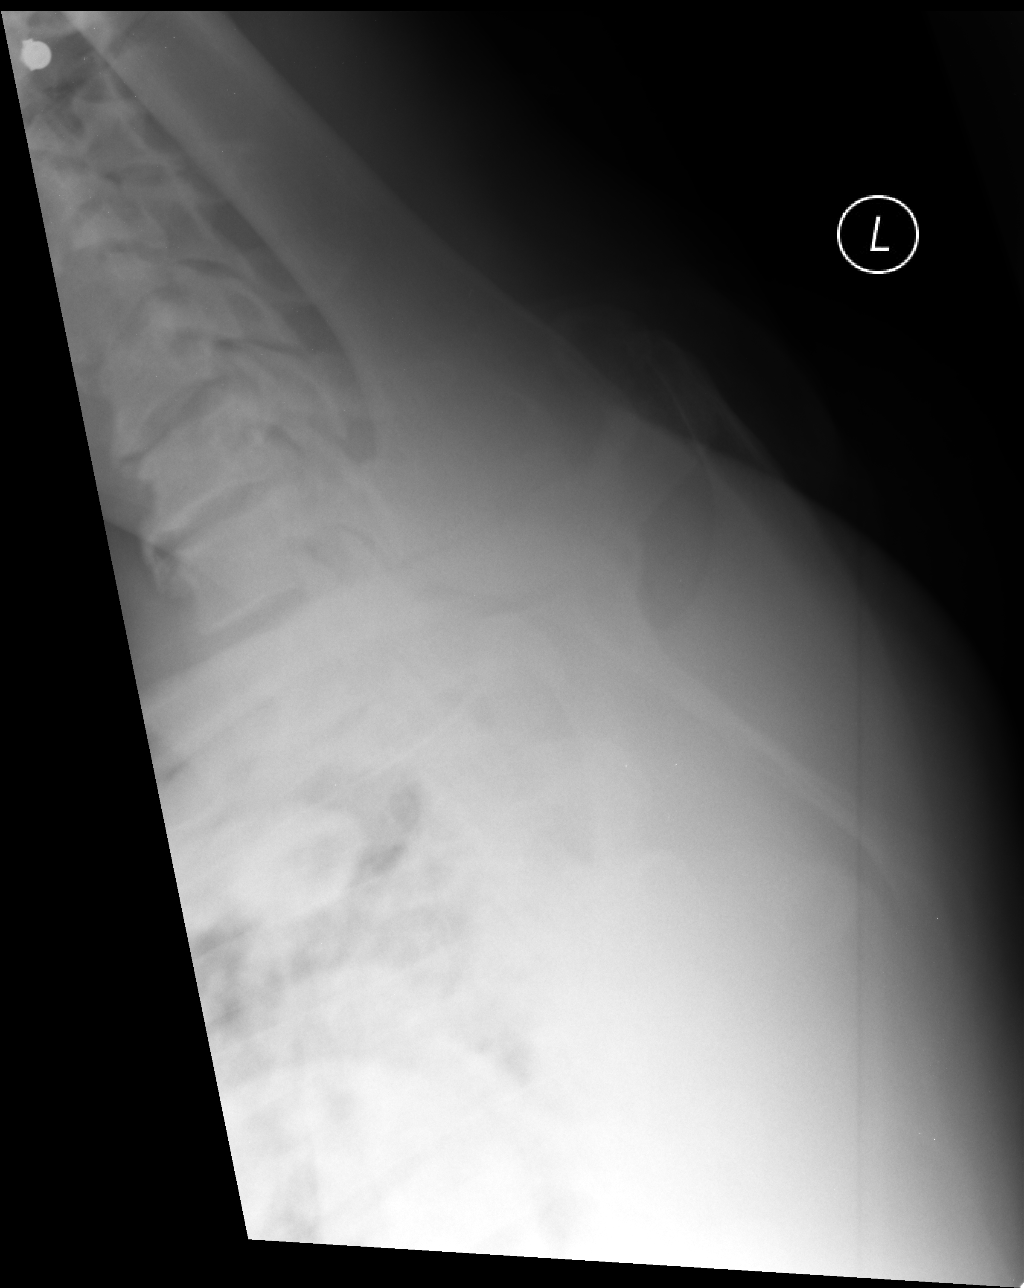

[AP (3 of 3)]
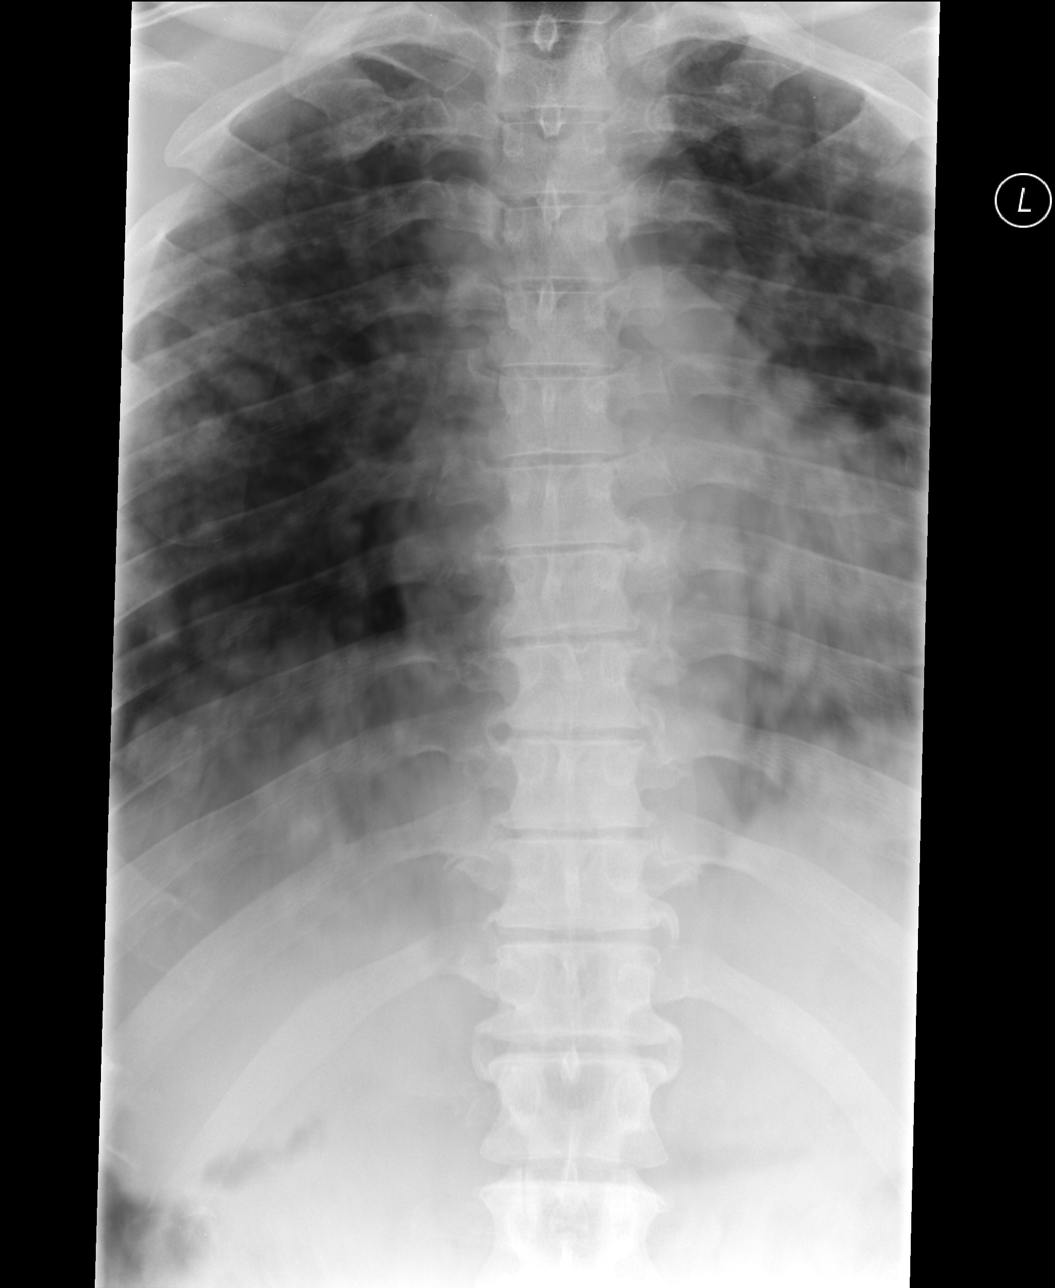

[4 of 4 positions shown; findings below may reference images not displayed]

FINDINGS: No bone destruction or fracture. There are slight degenerative
osteophytes in the lower thoracic spine.

There are innumerable pulmonary nodules bilaterally worrisome for
metastatic disease or sarcoidosis.
IMPRESSION: No significant abnormality of the thoracic spine. Multiple pulmonary
nodules.

## 2015-06-20 IMAGING — CT CT ABD-PEL WO/W CM
2 of 9 series · 12 of 46 positions shown, 18 images · IV contrast (OMNIPAQUE)
Comparison: Partial comparison CTA chest dated 07/27/2013

CLINICAL DATA: Suspected hepatocellular carcinoma with lung
metastases on CTA chest

EXAM:
CT ABDOMEN AND PELVIS WITHOUT AND WITH CONTRAST
TECHNIQUE: Multidetector CT imaging of the abdomen and pelvis was performed
following the standard protocol before and following the bolus
administration of intravenous contrast.
CONTRAST:  100mL OMNIPAQUE IOHEXOL 300 MG/ML  SOLN

[Series 5: liver portal venous · axial · portal-venous · 0.82mm/px · z∈[-510,-106]mm · 9 of 167 slices shown, 15 images]
[im 16/167  soft-tissue]
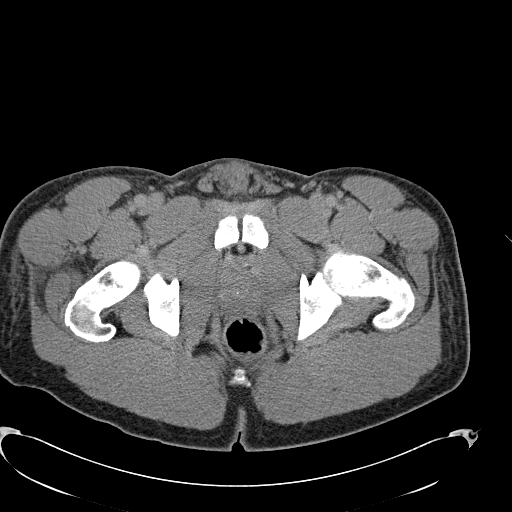
[im 16/167  bone]
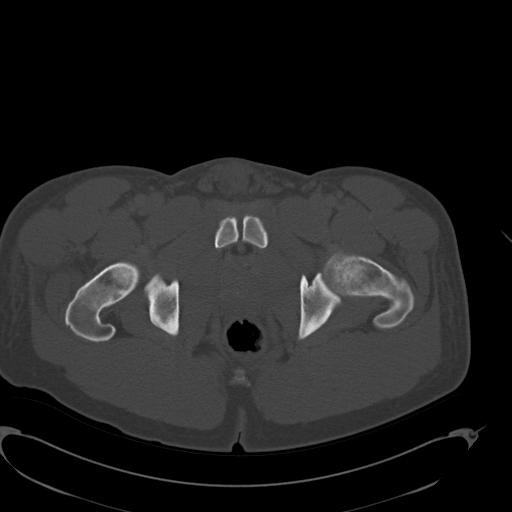
[im 31/167  soft-tissue]
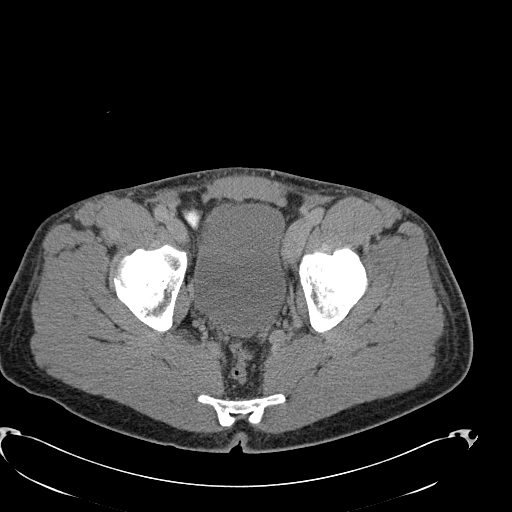
[im 46/167  soft-tissue]
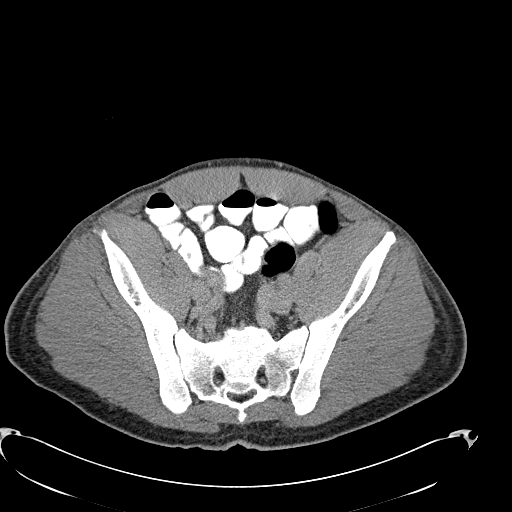
[im 61/167  soft-tissue]
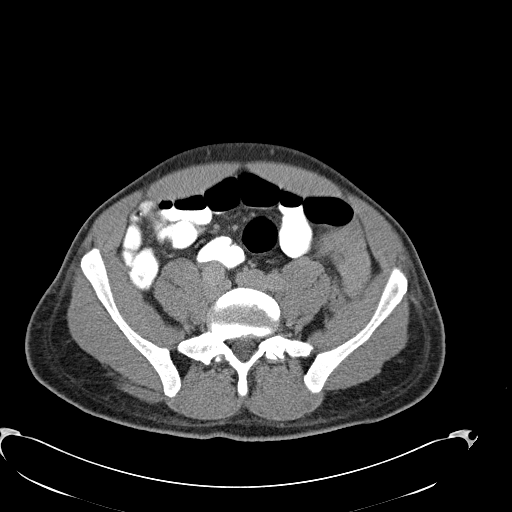
[im 91/167  soft-tissue]
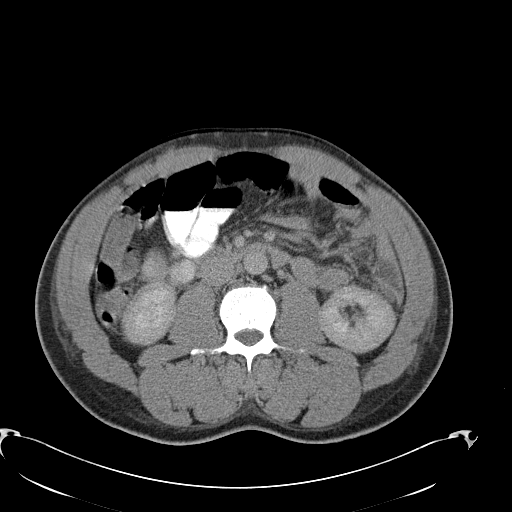
[im 106/167  soft-tissue]
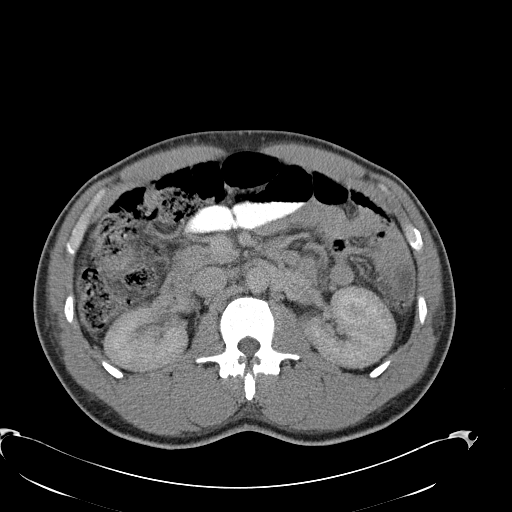
[im 106/167  lung]
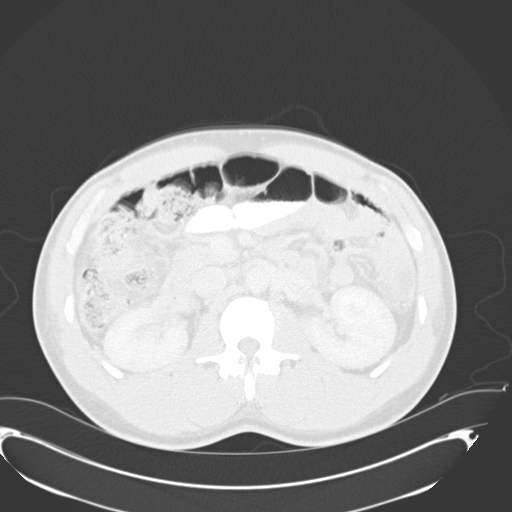
[im 121/167  soft-tissue]
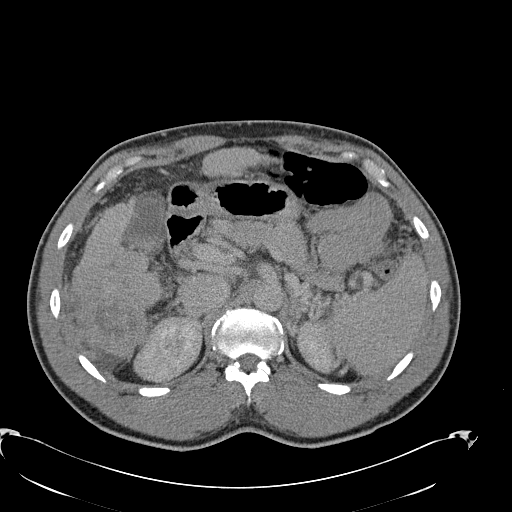
[im 121/167  lung]
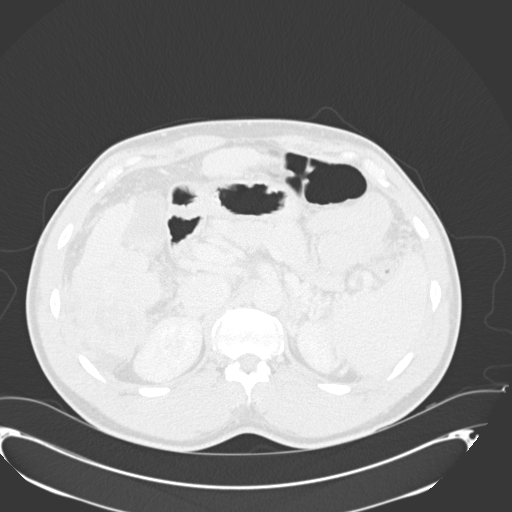
[im 136/167  soft-tissue]
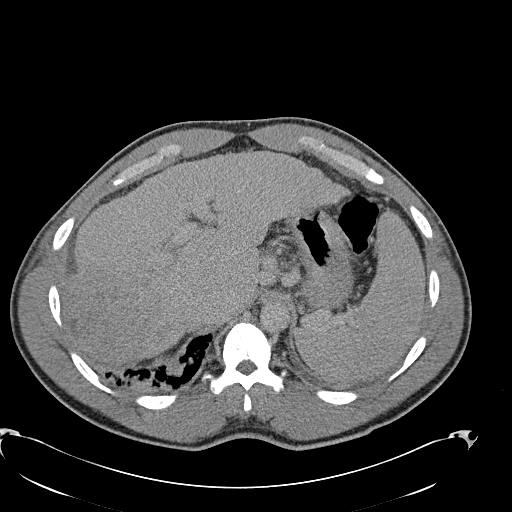
[im 136/167  lung]
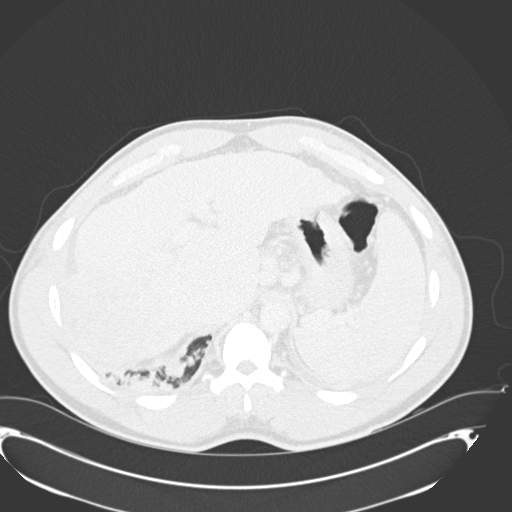
[im 151/167  soft-tissue]
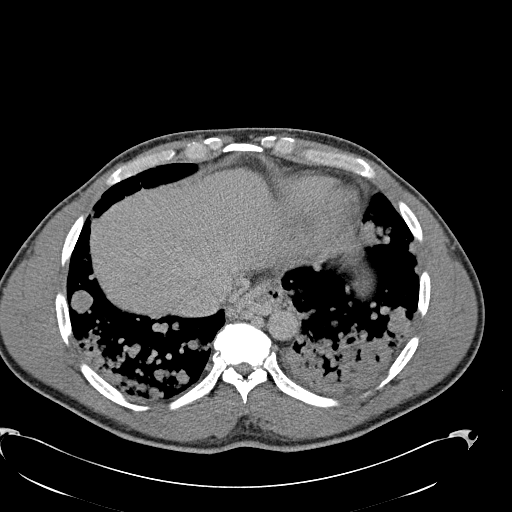
[im 151/167  lung]
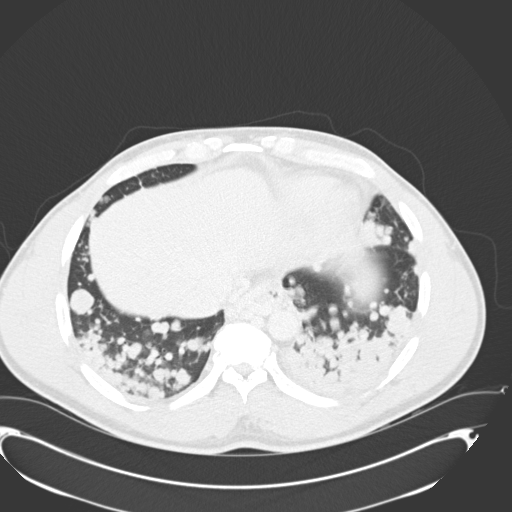
[im 151/167  bone]
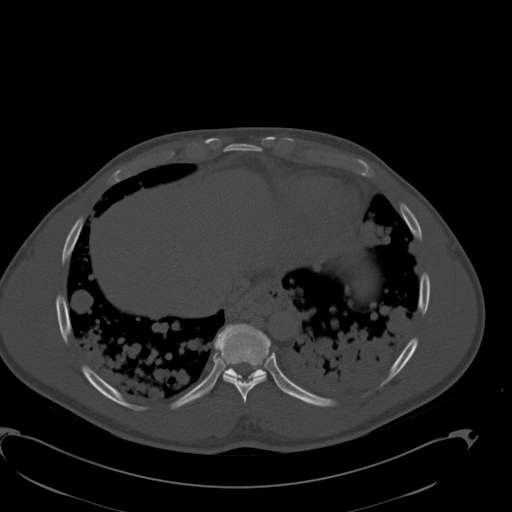

[Series 602: <mpr thick range> · coronal · 0.82mm/px · 3 of 93 slices shown]
[im 24/93  soft-tissue]
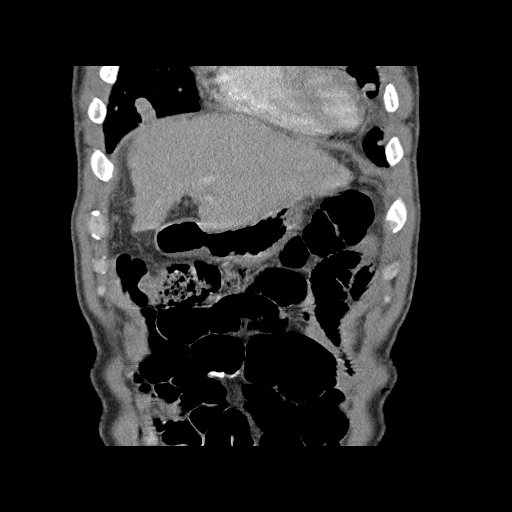
[im 47/93  soft-tissue]
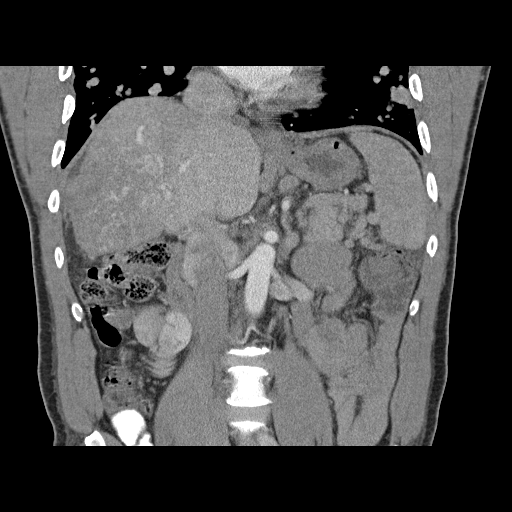
[im 70/93  soft-tissue]
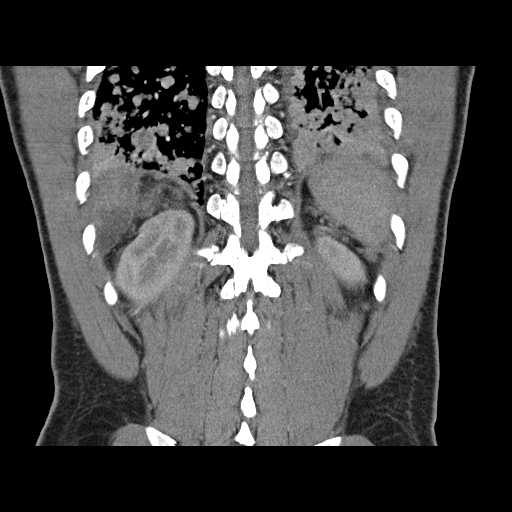

[12 of 46 positions shown; findings below may reference images not displayed]

FINDINGS: Suboptimal contrast opacification.  Motion degraded images.

Innumerable pulmonary metastases.

Cirrhosis. Ill-defined 8.5 x 9.6 cm mass in the posterior segment
right hepatic lobe, highly suspicious for hepatocellular carcinoma.
Enhancing tumor within the posterior branch of the right portal vein
is suspected (series 3/ image 27).

However, the main portal vein, while poorly opacified, likely
remains patent (series 5/ images 37, 45, and 56).

Mild splenomegaly, measuring 14.9 cm in maximal dimension.

Gastroesophageal and gastrosplenic varices. Suspected splenorenal
shunt.

No abdominopelvic ascites.

Pancreas and adrenal glands are within normal limits.

Gallbladder is notable for layering gallstones (series 5/image 50)
and possible mild gallbladder wall thickening. No intrahepatic or
extrahepatic ductal dilatation.

Kidneys are within normal limits.  No hydronephrosis.

No evidence of abdominal aortic aneurysm.

No suspicious abdominopelvic lymphadenopathy.

Prostate is unremarkable.

Bladder is within normal limits.

1.5 cm lytic osseous metastasis at T10 (series 605/image 59).
IMPRESSION: 9.6 cm mass in the posterior segment hepatic lobe, highly suspicious
for hepatocellular carcinoma.

Suspected enhancing tumor within the posterior branch of the right
portal vein. However, the main portal vein remains patent.

Cirrhosis. Stigmata of portal hypertension, including splenomegaly
and gastroesophageal/gastrosplenic varices.

Cholelithiasis.

Innumerable pulmonary metastases.  Lytic osseous metastasis at T10.
